# Patient Record
Sex: Female | Born: 1990 | Race: Black or African American | Hispanic: No | Marital: Single | Smoking: Never smoker
Health system: Southern US, Community
[De-identification: ages and names within clinical notes are randomized; demographics above are authoritative.]

## PROBLEM LIST (undated history)

## (undated) ENCOUNTER — Inpatient Hospital Stay (HOSPITAL_COMMUNITY): Payer: Self-pay

## (undated) DIAGNOSIS — D6800 Von Willebrand disease, unspecified: Secondary | ICD-10-CM

## (undated) DIAGNOSIS — B009 Herpesviral infection, unspecified: Secondary | ICD-10-CM

## (undated) DIAGNOSIS — S025XXA Fracture of tooth (traumatic), initial encounter for closed fracture: Secondary | ICD-10-CM

## (undated) HISTORY — PX: TONSILLECTOMY: SUR1361

## (undated) HISTORY — PX: INDUCED ABORTION: SHX677

## (undated) HISTORY — PX: DILATION AND CURETTAGE OF UTERUS: SHX78

## (undated) HISTORY — DX: Von Willebrand's disease: D68.0

## (undated) HISTORY — DX: Von Willebrand disease, unspecified: D68.00

---

## 2016-03-25 ENCOUNTER — Emergency Department (HOSPITAL_BASED_OUTPATIENT_CLINIC_OR_DEPARTMENT_OTHER)
Admission: EM | Admit: 2016-03-25 | Discharge: 2016-03-25 | Disposition: A | Discharge status: Home / Self Care | Payer: Commercial Managed Care - HMO | Attending: Dermatology | Admitting: Dermatology

## 2016-03-25 ENCOUNTER — Encounter (HOSPITAL_BASED_OUTPATIENT_CLINIC_OR_DEPARTMENT_OTHER): Payer: Self-pay

## 2016-03-25 DIAGNOSIS — Z5321 Procedure and treatment not carried out due to patient leaving prior to being seen by health care provider: Secondary | ICD-10-CM | POA: Insufficient documentation

## 2016-03-25 DIAGNOSIS — R51 Headache: Secondary | ICD-10-CM | POA: Insufficient documentation

## 2016-03-25 HISTORY — DX: Von Willebrand's disease: D68.0

## 2016-03-25 HISTORY — DX: Von Willebrand disease, unspecified: D68.00

## 2016-03-25 NOTE — ED Triage Notes (Signed)
C/o chills, body aches, HA 'feel like I have the flu" x today-NAD-steady gait-no flu shot

## 2016-03-25 NOTE — ED Notes (Signed)
Pt LWBS d/t wait time.  

## 2017-08-16 ENCOUNTER — Other Ambulatory Visit: Payer: Self-pay

## 2017-08-16 ENCOUNTER — Encounter (HOSPITAL_BASED_OUTPATIENT_CLINIC_OR_DEPARTMENT_OTHER): Payer: Self-pay | Admitting: *Deleted

## 2017-08-16 ENCOUNTER — Emergency Department (HOSPITAL_BASED_OUTPATIENT_CLINIC_OR_DEPARTMENT_OTHER)
Admission: EM | Admit: 2017-08-16 | Discharge: 2017-08-16 | Disposition: A | Discharge status: Home / Self Care | Payer: 59 | Attending: Emergency Medicine | Admitting: Emergency Medicine

## 2017-08-16 DIAGNOSIS — F411 Generalized anxiety disorder: Secondary | ICD-10-CM | POA: Diagnosis not present

## 2017-08-16 DIAGNOSIS — F329 Major depressive disorder, single episode, unspecified: Secondary | ICD-10-CM | POA: Diagnosis not present

## 2017-08-16 DIAGNOSIS — Z3A01 Less than 8 weeks gestation of pregnancy: Secondary | ICD-10-CM

## 2017-08-16 DIAGNOSIS — O9989 Other specified diseases and conditions complicating pregnancy, childbirth and the puerperium: Secondary | ICD-10-CM | POA: Insufficient documentation

## 2017-08-16 DIAGNOSIS — F4310 Post-traumatic stress disorder, unspecified: Secondary | ICD-10-CM | POA: Insufficient documentation

## 2017-08-16 DIAGNOSIS — F32A Depression, unspecified: Secondary | ICD-10-CM

## 2017-08-16 LAB — COMPREHENSIVE METABOLIC PANEL
ALT: 11 U/L — AB (ref 14–54)
AST: 16 U/L (ref 15–41)
Albumin: 4.1 g/dL (ref 3.5–5.0)
Alkaline Phosphatase: 38 U/L (ref 38–126)
Anion gap: 8 (ref 5–15)
BUN: 9 mg/dL (ref 6–20)
CHLORIDE: 103 mmol/L (ref 101–111)
CO2: 23 mmol/L (ref 22–32)
CREATININE: 0.78 mg/dL (ref 0.44–1.00)
Calcium: 8.7 mg/dL — ABNORMAL LOW (ref 8.9–10.3)
GFR calc Af Amer: >60 mL/min (ref >60)
Glucose, Bld: 97 mg/dL (ref 65–99)
Potassium: 3.5 mmol/L (ref 3.5–5.1)
Sodium: 134 mmol/L — ABNORMAL LOW (ref 135–145)
TOTAL PROTEIN: 7.1 g/dL (ref 6.5–8.1)
Total Bilirubin: 0.5 mg/dL (ref 0.3–1.2)

## 2017-08-16 LAB — CBC WITH DIFFERENTIAL/PLATELET
Basophils Absolute: 0 10*3/uL (ref 0.0–0.1)
Basophils Relative: 0 %
EOS PCT: 0 %
Eosinophils Absolute: 0 10*3/uL (ref 0.0–0.7)
HCT: 39.4 % (ref 36.0–46.0)
Hemoglobin: 13.6 g/dL (ref 12.0–15.0)
LYMPHS ABS: 1.7 10*3/uL (ref 0.7–4.0)
LYMPHS PCT: 22 %
MCH: 30.3 pg (ref 26.0–34.0)
MCHC: 34.5 g/dL (ref 30.0–36.0)
MCV: 87.8 fL (ref 78.0–100.0)
MONO ABS: 0.5 10*3/uL (ref 0.1–1.0)
Monocytes Relative: 6 %
Neutro Abs: 5.6 10*3/uL (ref 1.7–7.7)
Neutrophils Relative %: 72 %
PLATELETS: 216 10*3/uL (ref 150–400)
RBC: 4.49 MIL/uL (ref 3.87–5.11)
RDW: 13.3 % (ref 11.5–15.5)
WBC: 7.8 10*3/uL (ref 4.0–10.5)

## 2017-08-16 LAB — RAPID URINE DRUG SCREEN, HOSP PERFORMED
AMPHETAMINES: NOT DETECTED
Barbiturates: NOT DETECTED
Benzodiazepines: NOT DETECTED
Cocaine: NOT DETECTED
Opiates: NOT DETECTED
Tetrahydrocannabinol: POSITIVE — AB

## 2017-08-16 LAB — HCG, QUANTITATIVE, PREGNANCY: hCG, Beta Chain, Quant, S: 130 m[IU]/mL — ABNORMAL HIGH (ref <5)

## 2017-08-16 LAB — ETHANOL

## 2017-08-16 LAB — TSH: TSH: 1.623 u[IU]/mL (ref 0.350–4.500)

## 2017-08-16 LAB — PREGNANCY, URINE: Preg Test, Ur: POSITIVE — AB
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
## 2017-08-16 NOTE — ED Triage Notes (Signed)
Pt c/o "depression" denies SI or HI , states increased stress and insomnia x 2 weeks

## 2017-08-16 NOTE — ED Notes (Signed)
TTS at bedside speaking with pt 
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
## 2017-08-16 NOTE — Discharge Instructions (Addendum)
Take Benadryl as needed for sleep.  [facility]  Follow-up with the resources given for the depression/PTSD.

## 2017-08-29 ENCOUNTER — Other Ambulatory Visit: Payer: Self-pay

## 2017-08-29 ENCOUNTER — Encounter (HOSPITAL_BASED_OUTPATIENT_CLINIC_OR_DEPARTMENT_OTHER): Payer: Self-pay | Admitting: *Deleted

## 2017-08-29 ENCOUNTER — Emergency Department (HOSPITAL_BASED_OUTPATIENT_CLINIC_OR_DEPARTMENT_OTHER): Payer: 59

## 2017-08-29 ENCOUNTER — Emergency Department (HOSPITAL_BASED_OUTPATIENT_CLINIC_OR_DEPARTMENT_OTHER)
Admission: EM | Admit: 2017-08-29 | Discharge: 2017-08-29 | Disposition: A | Discharge status: Home / Self Care | Payer: 59 | Attending: Emergency Medicine | Admitting: Emergency Medicine

## 2017-08-29 DIAGNOSIS — Z3A01 Less than 8 weeks gestation of pregnancy: Secondary | ICD-10-CM | POA: Insufficient documentation

## 2017-08-29 DIAGNOSIS — R1030 Lower abdominal pain, unspecified: Secondary | ICD-10-CM | POA: Insufficient documentation

## 2017-08-29 DIAGNOSIS — O26851 Spotting complicating pregnancy, first trimester: Secondary | ICD-10-CM | POA: Insufficient documentation

## 2017-08-29 DIAGNOSIS — O4690 Antepartum hemorrhage, unspecified, unspecified trimester: Secondary | ICD-10-CM

## 2017-08-29 LAB — URINALYSIS, ROUTINE W REFLEX MICROSCOPIC
BILIRUBIN URINE: NEGATIVE
Bilirubin Urine: NEGATIVE
GLUCOSE, UA: NEGATIVE mg/dL
GLUCOSE, UA: NEGATIVE mg/dL
HGB URINE DIPSTICK: NEGATIVE
KETONES UR: NEGATIVE mg/dL
KETONES UR: NEGATIVE mg/dL
Leukocytes, UA: NEGATIVE
NITRITE: NEGATIVE
Nitrite: NEGATIVE
PH: 6 (ref 5.0–8.0)
PROTEIN: NEGATIVE mg/dL
Protein, ur: NEGATIVE mg/dL
Specific Gravity, Urine: >1.03 — ABNORMAL HIGH (ref 1.005–1.030)
Specific Gravity, Urine: <1.005 — ABNORMAL LOW (ref 1.005–1.030)
pH: 7 (ref 5.0–8.0)

## 2017-08-29 LAB — WET PREP, GENITAL
SPERM: NONE SEEN
Trich, Wet Prep: NONE SEEN
Yeast Wet Prep HPF POC: NONE SEEN

## 2017-08-29 LAB — URINALYSIS, MICROSCOPIC (REFLEX)

## 2017-08-29 LAB — BASIC METABOLIC PANEL
ANION GAP: 7 (ref 5–15)
BUN: 8 mg/dL (ref 6–20)
CALCIUM: 8.6 mg/dL — AB (ref 8.9–10.3)
CO2: 23 mmol/L (ref 22–32)
Chloride: 106 mmol/L (ref 101–111)
Creatinine, Ser: 0.73 mg/dL (ref 0.44–1.00)
GLUCOSE: 100 mg/dL — AB (ref 65–99)
POTASSIUM: 3.6 mmol/L (ref 3.5–5.1)
Sodium: 136 mmol/L (ref 135–145)

## 2017-08-29 LAB — CBC WITH DIFFERENTIAL/PLATELET
BASOS ABS: 0 10*3/uL (ref 0.0–0.1)
BASOS PCT: 0 %
Eosinophils Absolute: 0 10*3/uL (ref 0.0–0.7)
Eosinophils Relative: 0 %
HEMATOCRIT: 37.8 % (ref 36.0–46.0)
HEMOGLOBIN: 13.1 g/dL (ref 12.0–15.0)
LYMPHS PCT: 23 %
Lymphs Abs: 1.6 10*3/uL (ref 0.7–4.0)
MCH: 30.2 pg (ref 26.0–34.0)
MCHC: 34.7 g/dL (ref 30.0–36.0)
MCV: 87.1 fL (ref 78.0–100.0)
MONO ABS: 0.4 10*3/uL (ref 0.1–1.0)
MONOS PCT: 5 %
NEUTROS ABS: 5.1 10*3/uL (ref 1.7–7.7)
NEUTROS PCT: 72 %
Platelets: 243 10*3/uL (ref 150–400)
RBC: 4.34 MIL/uL (ref 3.87–5.11)
RDW: 13.1 % (ref 11.5–15.5)
WBC: 7.1 10*3/uL (ref 4.0–10.5)

## 2017-08-29 LAB — HCG, QUANTITATIVE, PREGNANCY: hCG, Beta Chain, Quant, S: 1999 m[IU]/mL — ABNORMAL HIGH (ref <5)

## 2017-08-29 LAB — ABO/RH
ABO/RH(D): B NEG
WEAK D: POSITIVE

## 2017-08-29 LAB — RH IG WORKUP (INCLUDES ABO/RH)
ABO/RH(D): B NEG
ANTIBODY SCREEN: NEGATIVE
GESTATIONAL AGE(WKS): 6

## 2017-08-29 MED ORDER — RHO D IMMUNE GLOBULIN 1500 UNIT/2ML IJ SOSY
300.0000 ug | PREFILLED_SYRINGE | Freq: Once | INTRAMUSCULAR | Status: AC
  Administered 2017-08-29: 300 ug via INTRAMUSCULAR
  Filled 2017-08-29: qty 2

## 2017-08-29 MED ORDER — PRENATAL COMPLETE 14-0.4 MG PO TABS: ORAL_TABLET | ORAL | 0 refills | Status: DC

## 2017-08-29 NOTE — ED Provider Notes (Signed)
[facility] EMERGENCY DEPARTMENT Provider Note   CSN: [id number] Arrival date & time: 08/29/17  1458     History   Chief Complaint Chief Complaint  Patient presents with  . Vaginal Bleeding    HPI Jessica Rojas is a 27 y.o. female G1P0 who is currently pregnant (diagnosed during recent visit to the ED on 4/17) with a history of von Willebrand's disease who presents emergency department today for concerns of vaginal bleeding.  Patient states that she finished urinating when she wiped and noticed a dime sized blood clot on the tissue paper.  She notes this is the only occurrence of eating.  No gross vaginal eating.  No other blood clots.  Notes over the last several days she has had some lower abdominal cramping but denies any pain.  She notes she has not followed up with OB/GYN and has an appointment on May 6 to establish care.  She has not been taking prenatal vitamins.  She denies any fever, chills, nausea/vomiting, urinary frequency, urinary urgency, dysuria, headache, vaginal discharge, abdominal trauma. Patinet's LMP on March 17th.   HPI  Past Medical History:  Diagnosis Date  . Von Willebrand disease (HCC)     There are no active problems to display for this patient.   Past Surgical History:  Procedure Laterality Date  . TONSILLECTOMY       OB History    Gravida  1   Para      Term      Preterm      AB      Living        SAB      TAB      Ectopic      Multiple      Live Births               Home Medications    Prior to Admission medications   Not on File    Family History History reviewed. No pertinent family history.  Social History Social History   Tobacco Use  . Smoking status: Never Smoker  . Smokeless tobacco: Never Used  Substance Use Topics  . Alcohol use: No  . Drug use: No     Allergies   Tramadol   Review of Systems Review of Systems  All other systems reviewed and are negative.    Physical  Exam Updated Vital Signs Ht  (1.575 m)   Wt 64.9 kg (143 lb)   LMP 07/16/2017   BMI 26.16 kg/m   Physical Exam  Constitutional: She appears well-developed and well-nourished.  HENT:  Head: Normocephalic and atraumatic.  Right Ear: External ear normal.  Left Ear: External ear normal.  Nose: Nose normal.  Mouth/Throat: Uvula is midline, oropharynx is clear and moist and mucous membranes are normal. No tonsillar exudate.  Eyes: Pupils are equal, round, and reactive to light. Right eye exhibits no discharge. Left eye exhibits no discharge. No scleral icterus.  Neck: Trachea normal. Neck supple. No spinous process tenderness present. No neck rigidity. Normal range of motion present.  Cardiovascular: Normal rate, regular rhythm and intact distal pulses.  No murmur heard. Pulses:      Radial pulses are 2+ on the right side, and 2+ on the left side.       Dorsalis pedis pulses are 2+ on the right side, and 2+ on the left side.       Posterior tibial pulses are 2+ on the right side, and 2+ on the  left side.  No lower extremity swelling or edema. Calves symmetric in size bilaterally.  Pulmonary/Chest: Effort normal and breath sounds normal. She exhibits no tenderness.  Abdominal: Soft. Bowel sounds are normal. She exhibits no distension. There is no tenderness. There is no rigidity, no rebound, no guarding and no CVA tenderness.  Mildly gravid abdomen without any distention, tenderness, rebound, rigidity or guarding.  Genitourinary:  Genitourinary Comments: Exam performed by Jacinto Halim, exam chaperoned Pelvic exam: normal external genitalia without evidence of trauma. VULVA: normal appearing vulva with no masses, tenderness or lesion. VAGINA: normal appearing vagina with normal color and discharge, no lesions. CERVIX: normal appearing cervix without lesions, cervical motion tenderness absent, cervical os closed with out purulent discharge or bleeding. Wet prep and DNA probe for  chlamydia and GC obtained.   ADNEXA: normal adnexa in size, nontender and no masses UTERUS: uterus is normal size, shape, consistency and nontender.   Musculoskeletal: She exhibits no edema.  Lymphadenopathy:    She has no cervical adenopathy.  Neurological: She is alert.  Skin: Skin is warm and dry. No rash noted. She is not diaphoretic.  Psychiatric: She has a normal mood and affect.  Nursing note and vitals reviewed.    ED Treatments / Results  Labs (all labs ordered are listed, but only abnormal results are displayed) Labs Reviewed  WET PREP, GENITAL - Abnormal; Notable for the following components:      Result Value   Clue Cells Wet Prep HPF POC PRESENT (*)    WBC, Wet Prep HPF POC MODERATE (*)    All other components within normal limits  URINALYSIS, ROUTINE W REFLEX MICROSCOPIC - Abnormal; Notable for the following components:   APPearance CLOUDY (*)    Specific Gravity, Urine >1.030 (*)    Hgb urine dipstick LARGE (*)    Leukocytes, UA TRACE (*)    All other components within normal limits  BASIC METABOLIC PANEL - Abnormal; Notable for the following components:   Glucose, Bld 100 (*)    Calcium 8.6 (*)    All other components within normal limits  HCG, QUANTITATIVE, PREGNANCY - Abnormal; Notable for the following components:   hCG, Beta Chain, Quant, S 1,999 (*)    All other components within normal limits  URINALYSIS, MICROSCOPIC (REFLEX) - Abnormal; Notable for the following components:   Bacteria, UA MANY (*)    All other components within normal limits  URINALYSIS, ROUTINE W REFLEX MICROSCOPIC - Abnormal; Notable for the following components:   Specific Gravity, Urine <1.005 (*)    All other components within normal limits  URINE CULTURE  CBC WITH DIFFERENTIAL/PLATELET  ABO/RH  RH IG WORKUP (INCLUDES ABO/RH)  GC/CHLAMYDIA PROBE AMP ([place]) NOT AT [facility]    EKG None  Radiology US Ob Comp < 14 Wks  Result Date: 08/29/2017 CLINICAL DATA:  Vaginal  bleeding. EXAM: OBSTETRIC <14 WK [place] AND TRANSVAGINAL OB US TECHNIQUE: Both transabdominal and transvaginal ultrasound examinations were performed for complete evaluation of the gestation as well as the maternal uterus, adnexal regions, and pelvic cul-de-sac. Transvaginal technique was performed to assess early pregnancy. COMPARISON:  None. FINDINGS: Intrauterine gestational sac: Single Yolk sac:  Not Visualized. Embryo:  Not Visualized. Cardiac Activity: Not Visualized. MSD: 5 mm   5 w   2 d Maternal uterus/adnexae: Subchorionic hemorrhage: None visualized. Right ovary: Normal Left ovary: Small heterogeneous structure within the left ovary measures 1.1 x 0.6 x 0.6 cm. Other :None Free fluid:  None IMPRESSION: 1. Probable  early intrauterine gestational sac, but no yolk sac, fetal pole, or cardiac activity yet visualized. Recommend follow-up quantitative B-HCG levels and follow-up US in 14 days to assess viability. This recommendation follows SRU consensus guidelines: Diagnostic Criteria for Nonviable Pregnancy Early in the First Trimester. Malva Limes Med 2013; 295:6213-08. 2. Small heterogeneous solid-appearing structure within the left ovary measuring 11 mm. Indeterminate. Attention on follow-up imaging advise. Electronically Signed   By: Signa Kell M.D.   On: 08/29/2017 17:11   US Ob Transvaginal  Result Date: 08/29/2017 CLINICAL DATA:  Vaginal bleeding. EXAM: OBSTETRIC <14 WK [place] AND TRANSVAGINAL OB US TECHNIQUE: Both transabdominal and transvaginal ultrasound examinations were performed for complete evaluation of the gestation as well as the maternal uterus, adnexal regions, and pelvic cul-de-sac. Transvaginal technique was performed to assess early pregnancy. COMPARISON:  None. FINDINGS: Intrauterine gestational sac: Single Yolk sac:  Not Visualized. Embryo:  Not Visualized. Cardiac Activity: Not Visualized. MSD: 5 mm   5 w   2 d Maternal uterus/adnexae: Subchorionic hemorrhage: None visualized. Right  ovary: Normal Left ovary: Small heterogeneous structure within the left ovary measures 1.1 x 0.6 x 0.6 cm. Other :None Free fluid:  None IMPRESSION: 1. Probable early intrauterine gestational sac, but no yolk sac, fetal pole, or cardiac activity yet visualized. Recommend follow-up quantitative B-HCG levels and follow-up US in 14 days to assess viability. This recommendation follows SRU consensus guidelines: Diagnostic Criteria for Nonviable Pregnancy Early in the First Trimester. Malva Limes Med 2013; 657:8469-62. 2. Small heterogeneous solid-appearing structure within the left ovary measuring 11 mm. Indeterminate. Attention on follow-up imaging advise. Electronically Signed   By: Signa Kell M.D.   On: 08/29/2017 17:11    Procedures Procedures (including critical care time)  Medications Ordered in ED Medications - No data to display   Initial Impression / Assessment and Plan / ED Course  I have reviewed the triage vital signs and the nursing notes.  Pertinent labs & imaging results that were available during my care of the patient were reviewed by me and considered in my medical decision making (see chart for details).     27 y.o. female G1P0 who is currently pregnant (diagnosed during recent visit to the ED on 4/17 with LMP on March 17th) with a history of von Willebrand's disease presenting for vaginal bleeding (she noticed a dime sized blood clot on tissue of toilet paper earlier today). She notes she has had some abdominal cramping but denies pain. Her vital signs are reassuring on presentation. She is without hypotension, tachycardia and she appears non-ill on exam (also no HTN or HA). Her abdomen is mildly gravid without distension, tenderness, or peritoneal signs.  Cervical os is closed and no bleeding or discharge is visualized.  Blood work and ultrasound ordered to evaluate.  Patient is an initial urinalysis was a contaminated catch.  Repeat urinalysis without evidence of infection.   Blood work is reassuring.  No anemia.  Beta HcG 1199.  Ultrasound shows probable early intrauterine gestational sac but no yolk sac, fetal pole or cardiac activity.  Possible threatened abortion.  I discussed these results with Dr. Jolayne Panther of women's who recommended that the patient follow-up at the med center OB/GYN location (where she is to establish care on May 6) to have repeat blood testing done.  She recommended that we keep the patient until the ABO/Rh has resulted.  She asked that if patient is Rh- we give a dose of RhoGam. I have discussed these findings with  the patient.  We have talked about the importance of following up.  We have discussed return precautions and she states understanding.  With ABO/Rh pending, case signed out to [employer].  Plan if patient is Rh- to give dose of RhoGam.  Patient is to follow-up with OB in 2 days, Thursday, 08/31/2017) to have repeat blood testing done.  Patient needs to be started on prenatal vitamins.    Patient case discussed with Dr. Dalene Seltzer who is in agreement with plan.  Final Clinical Impressions(s) / ED Diagnoses   Final diagnoses:  Vaginal bleeding in pregnancy    ED Discharge Orders        Ordered    Prenatal Vit-Fe Fumarate-FA (PRENATAL COMPLETE) 14-0.4 MG TABS     08/29/17 1908       Princella Pellegrini 08/29/17 1917    Alvira Monday, MD 08/31/17 1324

## 2017-08-29 NOTE — Discharge Instructions (Addendum)
You were seen here today for vaginal bleeding in the setting of pregnancy. You received blood work, urinalysis as well as an ultrasound. Blood work shows normal hemoglobin with a beta-hCG of 1199.  Your ultrasound shows probable early intrauterine gestational sac but no yolk sac, fetal pole or cardiac activity.  I have discussed your case with the OBGYN on call. They would like you to follow up in 2 days for repeat blood work at the [facility] office here at [employer].  Please return sooner if you have any abdominal pain, vaginal bleeding or other concerning symptoms.

## 2017-08-29 NOTE — ED Triage Notes (Signed)
Pt c/ vaginal bleeding with clots ,preg LMP march 17 ^ weeks preg,  Lower abd cramping , denies back pain

## 2017-08-29 NOTE — ED Provider Notes (Signed)
Signout from [employer], PA-C at shift change  Patient with vaginal bleeding in the setting of pregnancy.  hCG is 1999.  Otherwise labs stable.  Ultrasound shows probable early intrauterine gestational sac, but no yolk sac, fetal pole, or cardiac activity visualized.  Casimiro Needle spoke with OB/GYN on-call who advised to follow-up in the clinic at 2 days.  Rh typing pending at time of transfer of care patient Rh- and RhoGam administered prior to discharge.  Patient understands and agrees with plan.  Patient vitals stable throughout ED course and discharged in satisfactory condition.   Emi Holes, PA-C 08/30/17 1610    Alvira Monday, MD 09/02/17 1215

## 2017-08-30 LAB — URINE CULTURE: CULTURE: NO GROWTH

## 2017-08-30 LAB — GC/CHLAMYDIA PROBE AMP (~~LOC~~) NOT AT ARMC
Chlamydia: NEGATIVE
Neisseria Gonorrhea: NEGATIVE

## 2017-08-31 ENCOUNTER — Other Ambulatory Visit: Payer: 59

## 2017-08-31 DIAGNOSIS — O4690 Antepartum hemorrhage, unspecified, unspecified trimester: Secondary | ICD-10-CM

## 2017-08-31 NOTE — Progress Notes (Signed)
Follow up HCG from ED per Dr. Jolayne Panther. Sent to lab for collection. Armandina Stammer RN

## 2017-08-31 NOTE — Addendum Note (Signed)
Addended by: Anell Barr on: 08/31/2017 08:49 AM   Modules accepted: Orders

## 2017-09-01 ENCOUNTER — Inpatient Hospital Stay (HOSPITAL_COMMUNITY): Payer: 59

## 2017-09-01 ENCOUNTER — Telehealth: Payer: Self-pay

## 2017-09-01 ENCOUNTER — Encounter (HOSPITAL_COMMUNITY): Payer: Self-pay | Admitting: *Deleted

## 2017-09-01 ENCOUNTER — Inpatient Hospital Stay (HOSPITAL_COMMUNITY)
Admission: AD | Admit: 2017-09-01 | Discharge: 2017-09-01 | Disposition: A | Discharge status: Home / Self Care | Payer: 59 | Source: Ambulatory Visit | Attending: Obstetrics & Gynecology | Admitting: Obstetrics & Gynecology

## 2017-09-01 DIAGNOSIS — Z3A01 Less than 8 weeks gestation of pregnancy: Secondary | ICD-10-CM | POA: Insufficient documentation

## 2017-09-01 DIAGNOSIS — O26851 Spotting complicating pregnancy, first trimester: Secondary | ICD-10-CM | POA: Diagnosis present

## 2017-09-01 DIAGNOSIS — O26859 Spotting complicating pregnancy, unspecified trimester: Secondary | ICD-10-CM

## 2017-09-01 LAB — URINALYSIS, ROUTINE W REFLEX MICROSCOPIC
Bilirubin Urine: NEGATIVE
GLUCOSE, UA: NEGATIVE mg/dL
HGB URINE DIPSTICK: NEGATIVE
KETONES UR: 80 mg/dL — AB
LEUKOCYTES UA: NEGATIVE
Nitrite: NEGATIVE
PROTEIN: NEGATIVE mg/dL
Specific Gravity, Urine: 1.023 (ref 1.005–1.030)
pH: 6 (ref 5.0–8.0)

## 2017-09-01 LAB — BETA HCG QUANT (REF LAB): hCG Quant: 2025 m[IU]/mL

## 2017-09-01 NOTE — MAU Note (Signed)
Possible SAB. Was seen Tues for spotting and had u/s and saw sac in uterus. Had repeat BHCG yest and did not rise appropriately. Had some spotting today but not a lot but is concerned. Mild cramping.

## 2017-09-01 NOTE — MAU Provider Note (Signed)
History     CSN: [id number]  Arrival date and time: 09/01/17 1946   First Provider Initiated Contact with Patient 09/01/17 2213      Chief Complaint  Patient presents with  . Vaginal Bleeding   HPI Ms. Jessica Rojas is a 27 y.o. G4P0030 at [redacted]w[redacted]d who presents to MAU today with complaint of spotting in pregnancy. The patient was seen and had IUGS without YS or FP seen last week when spotting started. It is unchanged, but when she went for hCG follow-up yesterday she was told rise was inappropriate and she was worried. She denies heavy bleeding, change in bleeding, pain or fever.   OB History    Gravida  4   Para      Term      Preterm      AB  3   Living  0     SAB      TAB  3   Ectopic      Multiple      Live Births              Past Medical History:  Diagnosis Date  . Von Willebrand disease (HCC)     Past Surgical History:  Procedure Laterality Date  . INDUCED ABORTION    . TONSILLECTOMY      History reviewed. No pertinent family history.  Social History   Tobacco Use  . Smoking status: Never Smoker  . Smokeless tobacco: Never Used  Substance Use Topics  . Alcohol use: No  . Drug use: No    Allergies:  Allergies  Allergen Reactions  . Tramadol Itching    Medications Prior to Admission  Medication Sig Dispense Refill Last Dose  . Prenatal Vit-Fe Fumarate-FA (PRENATAL COMPLETE) 14-0.4 MG TABS Take one pill daily. 60 each 0 09/01/2017 at Unknown time    Review of Systems  Constitutional: Negative for fever.  Gastrointestinal: Negative for abdominal pain, constipation, diarrhea, nausea and vomiting.  Genitourinary: Positive for vaginal bleeding. Negative for vaginal discharge.   Physical Exam   Blood pressure (!) 103/59, pulse 87, temperature 99.1 F (37.3 C), height  (1.575 m), weight 149 lb (67.6 kg), last menstrual period 07/17/2017.  Physical Exam  Nursing note and vitals reviewed. Constitutional: She is oriented to person,  place, and time. She appears well-developed and well-nourished. No distress.  HENT:  Head: Normocephalic and atraumatic.  Cardiovascular: Normal rate.  Respiratory: Effort normal.  GI: Soft. She exhibits no distension and no mass. There is no tenderness. There is no rebound and no guarding.  Neurological: She is alert and oriented to person, place, and time.  Skin: Skin is warm and dry. No erythema.  Psychiatric: She has a normal mood and affect.    Results for orders placed or performed during the hospital encounter of 09/01/17 (from the past 24 hour(s))  Urinalysis, Routine w reflex microscopic     Status: Abnormal   Collection Time: 09/01/17  8:15 PM  Result Value Ref Range   Color, Urine YELLOW YELLOW   APPearance HAZY (A) CLEAR   Specific Gravity, Urine 1.023 1.005 - 1.030   pH 6.0 5.0 - 8.0   Glucose, UA NEGATIVE NEGATIVE mg/dL   Hgb urine dipstick NEGATIVE NEGATIVE   Bilirubin Urine NEGATIVE NEGATIVE   Ketones, ur 80 (A) NEGATIVE mg/dL   Protein, ur NEGATIVE NEGATIVE mg/dL   Nitrite NEGATIVE NEGATIVE   Leukocytes, UA NEGATIVE NEGATIVE   US Ob Transvaginal  Result Date: 09/01/2017 CLINICAL DATA:  27 year old female with spotting. LMP: 07/17/2017 corresponding to an estimated gestational age of [redacted] weeks, 4 days. EXAM: TRANSVAGINAL OB ULTRASOUND TECHNIQUE: Transvaginal ultrasound was performed for complete evaluation of the gestation as well as the maternal uterus, adnexal regions, and pelvic cul-de-sac. COMPARISON:  Ultrasound dated 08/29/2017 FINDINGS: Intrauterine gestational sac: Single intrauterine gestational sac. Yolk sac:  Seen Embryo:  Not present at this time. Cardiac Activity: N/A MSD: 7 mm   5 w   2 d Subchorionic hemorrhage:  None visualized. Maternal uterus/adnexae: The maternal ovaries appear unremarkable. IMPRESSION: Single intrauterine gestational sac with an estimated gestational age of [redacted] weeks, 2 days based on today's mean sac diameter. No fetal pole identified at  this time. Continued follow-up with ultrasound in 7-11 days, or earlier if clinically indicated, recommended. Electronically Signed   By: Elgie Collard M.D.   On: 09/01/2017 22:05    MAU Course  Procedures None  MDM [place] today shows appropriate increase in GS size and development of YS.   Assessment and Plan  A: SIUP at 100w2d Spotting in pregnancy, first trimester   P:  Discharge home Bleeding/first trimester precautions discussed Patient advised to follow-up with [facility] as planned next week to start prenatal care Patient may return to MAU as needed or if her condition were to change or worsen   Vonzella Nipple, PA-C 09/01/2017, 10:19 PM

## 2017-09-01 NOTE — Progress Notes (Signed)
Julie Wenzel PA in to discuss u/s results and d/c plan. Written and verbal d/c instructions given and understanding voiced 

## 2017-09-01 NOTE — Discharge Instructions (Signed)
Vaginal Bleeding During Pregnancy, First Trimester °A small amount of bleeding (spotting) from the vagina is common in early pregnancy. Sometimes the bleeding is normal and is not a problem, and sometimes it is a sign of something serious. Be sure to tell your doctor about any bleeding from your vagina right away. °Follow these instructions at home: °· Watch your condition for any changes. °· Follow your doctor's instructions about how active you can be. °· If you are on bed rest: °? You may need to stay in bed and only get up to use the bathroom. °? You may be allowed to do some activities. °? If you need help, make plans for someone to help you. °· Write down: °? The number of pads you use each day. °? How often you change pads. °? How soaked (saturated) your pads are. °· Do not use tampons. °· Do not douche. °· Do not have sex or orgasms until your doctor says it is okay. °· If you pass any tissue from your vagina, save the tissue so you can show it to your doctor. °· Only take medicines as told by your doctor. °· Do not take aspirin because it can make you bleed. °· Keep all follow-up visits as told by your doctor. °Contact a doctor if: °· You bleed from your vagina. °· You have cramps. °· You have labor pains. °· You have a fever that does not go away after you take medicine. °Get help right away if: °· You have very bad cramps in your back or belly (abdomen). °· You pass large clots or tissue from your vagina. °· You bleed more. °· You feel light-headed or weak. °· You pass out (faint). °· You have chills. °· You are leaking fluid or have a gush of fluid from your vagina. °· You pass out while pooping (having a bowel movement). °This information is not intended to replace advice given to you by your health care provider. Make sure you discuss any questions you have with your health care provider. °Document Released: 09/02/2013 Document Revised: 09/24/2015 Document Reviewed: 12/24/2012 °Elsevier Interactive  Patient Education © 2018 Elsevier Inc. ° °

## 2017-09-01 NOTE — Telephone Encounter (Signed)
Patient called and made aware of HCG levels not rising appropriately. Reviewed bleeding precautions and advised if bleeding heavily she can go to [facility] [place] for evaluation. Also advised to go for any sever onset of abdominal pain.  Patient states understanding. Will keep appointment on Monday to discuss further plan of care. Armandina Stammer RN

## 2017-09-04 ENCOUNTER — Ambulatory Visit (INDEPENDENT_AMBULATORY_CARE_PROVIDER_SITE_OTHER): Payer: 59 | Admitting: Family Medicine

## 2017-09-04 ENCOUNTER — Encounter: Payer: Self-pay | Admitting: Family Medicine

## 2017-09-04 VITALS — BP 108/63 | Ht 62.0 in | Wt 149.0 lb

## 2017-09-04 DIAGNOSIS — O3680X0 Pregnancy with inconclusive fetal viability, not applicable or unspecified: Secondary | ICD-10-CM

## 2017-09-04 NOTE — Progress Notes (Signed)
   Subjective:    Patient ID: Jessica Rojas, female    DOB: [birth year], 28 y.o.   MRN: [id number]  HPI Patient seen for follow up of early pregnancy, spotting, and abnormal hormone rise. She had some spotting last week, went to ED on 4/30 with quant and [place], then had repeat quant on 5/2, with a repeat US on 5/3.   4/30: hcg quant 1999 (I did call to [facility] ED - they do not have an i-STAT hCG) 5/02: hcg quant 2025  [place] on 4/30: probable gestational sac measuring 5 weeks 2 days with no yolk sac, fetal pole, embryo. Ultrasound on 5/3: Probable gestational sac measuring 5 weeks 2 days with yolk sac present.  No fetal pole.  Patient otherwise feels well no cramping or contractions.  No spotting today  Review of Systems     Objective:   Physical Exam  Constitutional: She appears well-developed and well-nourished.  Cardiovascular: Normal rate and regular rhythm.  Pulmonary/Chest: Effort normal and breath sounds normal.  Abdominal: Soft. She exhibits no distension and no mass. There is no tenderness. There is no guarding.  Skin: Skin is warm and dry. Capillary refill takes less than 2 seconds.  Psychiatric: She has a normal mood and affect. Her behavior is normal. Judgment and thought content normal.      Assessment & Plan:  1. Pregnancy with uncertain fetal viability, single or unspecified fetus I discussed in detail the laboratory findings and the ultrasounds results.  I discussed that this was likely a nonviable pregnancy given the inappropriate rise in hCG level.  I also discussed with her that even with the appearance of yolk sac, this may be a pregnancy that is at high risk for failure or spontaneous abortion.  As options, I did give her choice to consider this a failed pregnancy with termination with Cytotec or to repeat an ultrasound in 10 days to evaluate viability.  Patient did opt to return in 10 days for transvaginal ultrasound to evaluate for viability.  All questions answered.

## 2017-09-04 NOTE — Progress Notes (Signed)
Patient went to MAU and YS was present. Armandina Stammer RN

## 2017-09-14 ENCOUNTER — Ambulatory Visit (INDEPENDENT_AMBULATORY_CARE_PROVIDER_SITE_OTHER): Payer: 59 | Admitting: Family Medicine

## 2017-09-14 VITALS — Wt 150.0 lb

## 2017-09-14 DIAGNOSIS — O3680X0 Pregnancy with inconclusive fetal viability, not applicable or unspecified: Secondary | ICD-10-CM

## 2017-09-14 NOTE — Progress Notes (Signed)
Patient presents for Ultrasound follow to determine if today is New OB.

## 2017-09-14 NOTE — Progress Notes (Signed)
   Subjective:    Patient ID: Jessica Rojas, female    DOB: 1990-11-20, 27 y.o.   MRN: [id number]  HPI Patient seen for follow up of early pregnancy, spotting, and abnormal hormone rise. She had some spotting last week, went to ED on 4/30 with quant and [place], then had repeat quant on 5/2, with a repeat US on 5/3.   4/30: hcg quant 1999 (I did call to [facility] ED - they do not have an i-STAT hCG) 5/02: hcg quant 2025  [place] on 4/30: probable gestational sac measuring 5 weeks 2 days with no yolk sac, fetal pole, embryo. Ultrasound on 5/3: Probable gestational sac measuring 5 weeks 2 days with yolk sac present.  No fetal pole.  Today, the patient had a repeat ultrasound which showed a gestational sac and a fetal pole without cardiac activity.  The peak fetal pole measures 8.5 mm.  She reports no symptoms of vaginal bleeding or cramping.  Review of Systems     Objective:   Physical Exam  Constitutional: She appears well-developed and well-nourished.  Cardiovascular: Normal rate and regular rhythm.  Pulmonary/Chest: Effort normal.  Abdominal: Soft. She exhibits no distension and no mass. There is no tenderness. There is no guarding.  Skin: Skin is warm and dry. Capillary refill takes less than 2 seconds.  Psychiatric: She has a normal mood and affect. Her behavior is normal. Judgment and thought content normal.      Assessment & Plan:  1. Pregnancy with uncertain fetal viability, single or unspecified fetus The patient meets criteria for failed pregnancy: Embryo is greater than 7 mm with out cardiac activity.  Additionally, there is no embryo with heartbeat 11 days after the ultrasound showing gestational sac with yolk sac.  I discussed this with the patient and her mother.  I offered medical evaluation with Cytotec.  The patient would like one more ultrasound in a week to assure that there is no heartbeat.  We will schedule the patient and evaluate next week.

## 2017-09-21 ENCOUNTER — Encounter (HOSPITAL_COMMUNITY): Payer: Self-pay | Admitting: *Deleted

## 2017-09-21 ENCOUNTER — Ambulatory Visit (INDEPENDENT_AMBULATORY_CARE_PROVIDER_SITE_OTHER): Payer: 59 | Admitting: Obstetrics & Gynecology

## 2017-09-21 ENCOUNTER — Other Ambulatory Visit: Payer: Self-pay

## 2017-09-21 ENCOUNTER — Other Ambulatory Visit: Payer: Self-pay | Admitting: Obstetrics and Gynecology

## 2017-09-21 VITALS — BP 108/64 | HR 74 | Wt 154.0 lb

## 2017-09-21 DIAGNOSIS — D6800 Von Willebrand disease, unspecified: Secondary | ICD-10-CM

## 2017-09-21 DIAGNOSIS — O021 Missed abortion: Secondary | ICD-10-CM

## 2017-09-21 NOTE — Progress Notes (Signed)
DATING AND VIABILITY SONOGRAM   Laquitha Hayworth is a 27 y.o. year old G62P0030 with LMP Patient's last menstrual period was 07/17/2017. which would correlate to  [redacted]w[redacted]d weeks gestation.  She has regular menstrual cycles.   She is here today for a confirmatory initial sonogram.    GESTATION: SINGLETON  FETAL ACTIVITY:          No cardiac activity. Placed color doppler over gestational sac and    ADNEXA: The ovaries are normal.   GESTATIONAL AGE AND  BIOMETRICS:  Gestational criteria: Estimated Date of Delivery: 05/08/18 by LMP now at [redacted]w[redacted]d  Previous Scans:3  GESTATIONAL SAC           1.21 cm        6 weeks                                                                                       TECHNICIAN COMMENTS: Patient mother present for ultrasound. Made patient aware of no change in gestational sac size. Will sit and review with provider today.  A copy of this report including all images has been saved and backed up to a second source for retrieval if needed. All measures and details of the anatomical scan, placentation, fluid volume and pelvic anatomy are contained in that report.  Armandina Stammer 09/21/2017 10:46 AM

## 2017-09-21 NOTE — Progress Notes (Signed)
History:  27 y.o. G4P0030 here today for MAB. Pt is s/p EAB x3. LMP 07/16/2017. She conceived April 1-5. She is concerned as she was expecting to maintain this pregnancy.  She still reports pregnancy sx and did not initially believe that she was not pregnant.  The following portions of the patient's history were reviewed and updated as appropriate: allergies, current medications, past family history, past medical history, past social history, past surgical history and problem list.  Review of Systems:  Pertinent items are noted in HPI.    Objective:  Physical Exam Blood pressure 108/64, pulse 74, weight 154 lb (69.9 kg), last menstrual period 07/17/2017.  CONSTITUTIONAL: Well-developed, well-nourished female in no acute distress.  HENT:  Normocephalic, atraumatic EYES: Conjunctivae and EOM are normal. No scleral icterus.  NECK: Normal range of motion SKIN: Skin is warm and dry. No rash noted. Not diaphoretic.No pallor. NEUROLGIC: Alert and oriented to person, place, and time. Normal coordination.    Labs and Imaging US Ob Comp < 14 Wks  Result Date: 08/29/2017 CLINICAL DATA:  Vaginal bleeding. EXAM: OBSTETRIC <14 WK [place] AND TRANSVAGINAL OB US TECHNIQUE: Both transabdominal and transvaginal ultrasound examinations were performed for complete evaluation of the gestation as well as the maternal uterus, adnexal regions, and pelvic cul-de-sac. Transvaginal technique was performed to assess early pregnancy. COMPARISON:  None. FINDINGS: Intrauterine gestational sac: Single Yolk sac:  Not Visualized. Embryo:  Not Visualized. Cardiac Activity: Not Visualized. MSD: 5 mm   5 w   2 d Maternal uterus/adnexae: Subchorionic hemorrhage: None visualized. Right ovary: Normal Left ovary: Small heterogeneous structure within the left ovary measures 1.1 x 0.6 x 0.6 cm. Other :None Free fluid:  None IMPRESSION: 1. Probable early intrauterine gestational sac, but no yolk sac, fetal pole, or cardiac activity yet  visualized. Recommend follow-up quantitative B-HCG levels and follow-up US in 14 days to assess viability. This recommendation follows SRU consensus guidelines: Diagnostic Criteria for Nonviable Pregnancy Early in the First Trimester. Malva Limes Med 2013; 161:0960-45. 2. Small heterogeneous solid-appearing structure within the left ovary measuring 11 mm. Indeterminate. Attention on follow-up imaging advise. Electronically Signed   By: Signa Kell M.D.   On: 08/29/2017 17:11   US Ob Transvaginal  Result Date: 09/01/2017 CLINICAL DATA:  27 year old female with spotting. LMP: 07/17/2017 corresponding to an estimated gestational age of [redacted] weeks, 4 days. EXAM: TRANSVAGINAL OB ULTRASOUND TECHNIQUE: Transvaginal ultrasound was performed for complete evaluation of the gestation as well as the maternal uterus, adnexal regions, and pelvic cul-de-sac. COMPARISON:  Ultrasound dated 08/29/2017 FINDINGS: Intrauterine gestational sac: Single intrauterine gestational sac. Yolk sac:  Seen Embryo:  Not present at this time. Cardiac Activity: N/A MSD: 7 mm   5 w   2 d Subchorionic hemorrhage:  None visualized. Maternal uterus/adnexae: The maternal ovaries appear unremarkable. IMPRESSION: Single intrauterine gestational sac with an estimated gestational age of [redacted] weeks, 2 days based on today's mean sac diameter. No fetal pole identified at this time. Continued follow-up with ultrasound in 7-11 days, or earlier if clinically indicated, recommended. Electronically Signed   By: Elgie Collard M.D.   On: 09/01/2017 22:05   US Ob Transvaginal  Result Date: 08/29/2017 CLINICAL DATA:  Vaginal bleeding. EXAM: OBSTETRIC <14 WK [place] AND TRANSVAGINAL OB US TECHNIQUE: Both transabdominal and transvaginal ultrasound examinations were performed for complete evaluation of the gestation as well as the maternal uterus, adnexal regions, and pelvic cul-de-sac. Transvaginal technique was performed to assess early pregnancy. COMPARISON:  None.  FINDINGS: Intrauterine  gestational sac: Single Yolk sac:  Not Visualized. Embryo:  Not Visualized. Cardiac Activity: Not Visualized. MSD: 5 mm   5 w   2 d Maternal uterus/adnexae: Subchorionic hemorrhage: None visualized. Right ovary: Normal Left ovary: Small heterogeneous structure within the left ovary measures 1.1 x 0.6 x 0.6 cm. Other :None Free fluid:  None IMPRESSION: 1. Probable early intrauterine gestational sac, but no yolk sac, fetal pole, or cardiac activity yet visualized. Recommend follow-up quantitative B-HCG levels and follow-up US in 14 days to assess viability. This recommendation follows SRU consensus guidelines: Diagnostic Criteria for Nonviable Pregnancy Early in the First Trimester. Malva Limes Med 2013; 213:0865-78. 2. Small heterogeneous solid-appearing structure within the left ovary measuring 11 mm. Indeterminate. Attention on follow-up imaging advise. Electronically Signed   By: Signa Kell M.D.   On: 08/29/2017 17:11    Assessment & Plan:  Missed abortion. I have reviewed with pt medical vs surgical management. Pt declines medical management.  Patient desires surgical management with dilation and curetage.  The risks of surgery were discussed in detail with the patient including but not limited to: bleeding which may require transfusion or reoperation; infection which may require prolonged hospitalization or re-hospitalization and antibiotic therapy; injury to bowel, bladder, ureters and major vessels or other surrounding organs; need for additional procedures including laparotomy; thromboembolic phenomenon, incisional problems and other postoperative or anesthesia complications.  Patient was told that the likelihood that her condition and symptoms will be treated effectively with this surgical management was very high; the postoperative expectations were also discussed in detail. The patient also understands the alternative treatment options which were discussed in full. All  questions were answered.  She was told that she will be contacted by our surgical scheduler regarding the time and date of her surgery; routine preoperative instructions of having nothing to eat or drink after midnight on the day prior to surgery and also coming to the hospital 1 1/2 hours prior to her time of surgery were also emphasized.  She was told she may be called for a preoperative appointment about a week prior to surgery and will be given further preoperative instructions at that visit. Printed patient education handouts about the procedure were given to the patient to review at home.  Total face-to-face time with patient was 15 min.  Greater than 50% was spent in counseling and coordination of care with the patient.   Takelia Urieta L. Harraway-Smith, M.D., [facility]

## 2017-09-21 NOTE — Patient Instructions (Signed)
Dilation and Curettage or Vacuum Curettage Dilation and curettage (D&C) and vacuum curettage are minor procedures. A D&C involves stretching (dilation) the cervix and scraping (curettage) the inside lining of the uterus (endometrium). During a D&C, tissue is gently scraped from the endometrium, starting from the top portion of the uterus down to the lowest part of the uterus (cervix). During a vacuum curettage, the lining and tissue in the uterus are removed with the use of gentle suction. Curettage may be performed to either diagnose or treat a problem. As a diagnostic procedure, curettage is performed to examine tissues from the uterus. A diagnostic curettage may be done if you have:  Irregular bleeding in the uterus.  Bleeding with the development of clots.  Spotting between menstrual periods.  Prolonged menstrual periods or other abnormal bleeding.  Bleeding after menopause.  No menstrual period (amenorrhea).  A change in size and shape of the uterus.  Abnormal endometrial cells discovered during a Pap test.  As a treatment procedure, curettage may be performed for the following reasons:  Removal of an IUD (intrauterine device).  Removal of retained placenta after giving birth.  Abortion.  Miscarriage.  Removal of endometrial polyps.  Removal of uncommon types of noncancerous lumps (fibroids).  Tell a health care provider about:  Any allergies you have, including allergies to prescribed medicine or latex.  All medicines you are taking, including vitamins, herbs, eye drops, creams, and over-the-counter medicines. This is especially important if you take any blood-thinning medicine. Bring a list of all of your medicines to your appointment.  Any problems you or family members have had with anesthetic medicines.  Any blood disorders you have.  Any surgeries you have had.  Your medical history and any medical conditions you have.  Whether you are pregnant or may be  pregnant.  Recent vaginal infections you have had.  Recent menstrual periods, bleeding problems you have had, and what form of birth control (contraception) you use. What are the risks? Generally, this is a safe procedure. However, problems may occur, including:  Infection.  Heavy vaginal bleeding.  Allergic reactions to medicines.  Damage to the cervix or other structures or organs.  Development of scar tissue (adhesions) inside the uterus, which can cause abnormal amounts of menstrual bleeding. This may make it harder to get pregnant in the future.  A hole (perforation) or puncture in the uterine wall. This is rare.  What happens before the procedure? Staying hydrated Follow instructions from your health care provider about hydration, which may include:  Up to 2 hours before the procedure - you may continue to drink clear liquids, such as water, clear fruit juice, black coffee, and plain tea.  Eating and drinking restrictions Follow instructions from your health care provider about eating and drinking, which may include:  8 hours before the procedure - stop eating heavy meals or foods such as meat, fried foods, or fatty foods.  6 hours before the procedure - stop eating light meals or foods, such as toast or cereal.  6 hours before the procedure - stop drinking milk or drinks that contain milk.  2 hours before the procedure - stop drinking clear liquids. If your health care provider told you to take your medicine(s) on the day of your procedure, take them with only a sip of water.  Medicines  Ask your health care provider about: ? Changing or stopping your regular medicines. This is especially important if you are taking diabetes medicines or blood thinners. ? Taking   medicines such as aspirin and ibuprofen. These medicines can thin your blood. Do not take these medicines before your procedure if your health care provider instructs you not to.  You may be given antibiotic  medicine to help prevent infection. General instructions  For 24 hours before your procedure, do not: ? Douche. ? Use tampons. ? Use medicines, creams, or suppositories in the vagina. ? Have sexual intercourse.  You may be given a pregnancy test on the day of the procedure.  Plan to have someone take you home from the hospital or clinic.  You may have a blood or urine sample taken.  If you will be going home right after the procedure, plan to have someone with you for 24 hours. What happens during the procedure?  To reduce your risk of infection: ? Your health care team will wash or sanitize their hands. ? Your skin will be washed with soap.  An IV tube will be inserted into one of your veins.  You will be given one of the following: ? A medicine that numbs the area in and around the cervix (local anesthetic). ? A medicine to make you fall asleep (general anesthetic).  You will lie down on your back, with your feet in foot rests (stirrups).  The size and position of your uterus will be checked.  A lubricated instrument (speculum or Sims retractor) will be inserted into the back side of your vagina. The speculum will be used to hold apart the walls of your vagina so your health care provider can see your cervix.  A tool (tenaculum) will be attached to the lip of the cervix to stabilize it.  Your cervix will be softened and dilated. This may be done by: ? Taking a medicine. ? Having tapered dilators or thin rods (laminaria) or gradual widening instruments (tapered dilators) inserted into your cervix.  A small, sharp, curved instrument (curette) will be used to scrape a small amount of tissue or cells from the endometrium or cervical canal. In some cases, gentle suction is applied with the curette. The curette will then be removed. The cells will be taken to a lab for testing. The procedure may vary among health care providers and hospitals. What happens after the  procedure?  You may have mild cramping, backache, pain, and light bleeding or spotting. You may pass small blood clots from your vagina.  You may have to wear compression stockings. These stockings help to prevent blood clots and reduce swelling in your legs.  Your blood pressure, heart rate, breathing rate, and blood oxygen level will be monitored until the medicines you were given have worn off. Summary  Dilation and curettage (D&C) involves stretching (dilation) the cervix and scraping (curettage) the inside lining of the uterus (endometrium).  After the procedure, you may have mild cramping, backache, pain, and light bleeding or spotting. You may pass small blood clots from your vagina.  Plan to have someone take you home from the hospital or clinic. This information is not intended to replace advice given to you by your health care provider. Make sure you discuss any questions you have with your health care provider. Document Released: 04/18/2005 Document Revised: 01/03/2016 Document Reviewed: 01/03/2016 Elsevier Interactive Patient Education  2018 [employer] Miscarriage A miscarriage is the sudden loss of an unborn baby (fetus) before the 20th week of pregnancy. Most miscarriages happen in the first 3 months of pregnancy. Sometimes, it happens before a woman even knows she is pregnant. A miscarriage   is also called a "spontaneous miscarriage" or "early pregnancy loss." Having a miscarriage can be an emotional experience. Talk with your caregiver about any questions you may have about miscarrying, the grieving process, and your future pregnancy plans. What are the causes?  Problems with the fetal chromosomes that make it impossible for the baby to develop normally. Problems with the baby's genes or chromosomes are most often the result of errors that occur, by chance, as the embryo divides and grows. The problems are not inherited from the parents.  Infection of the cervix or  uterus.  Hormone problems.  Problems with the cervix, such as having an incompetent cervix. This is when the tissue in the cervix is not strong enough to hold the pregnancy.  Problems with the uterus, such as an abnormally shaped uterus, uterine fibroids, or congenital abnormalities.  Certain medical conditions.  Smoking, drinking alcohol, or taking illegal drugs.  Trauma. Often, the cause of a miscarriage is unknown. What are the signs or symptoms?  Vaginal bleeding or spotting, with or without cramps or pain.  Pain or cramping in the abdomen or lower back.  Passing fluid, tissue, or blood clots from the vagina. How is this diagnosed? Your caregiver will perform a physical exam. You may also have an ultrasound to confirm the miscarriage. Blood or urine tests may also be ordered. How is this treated?  Sometimes, treatment is not necessary if you naturally pass all the fetal tissue that was in the uterus. If some of the fetus or placenta remains in the body (incomplete miscarriage), tissue left behind may become infected and must be removed. Usually, a dilation and curettage (D and C) procedure is performed. During a D and C procedure, the cervix is widened (dilated) and any remaining fetal or placental tissue is gently removed from the uterus.  Antibiotic medicines are prescribed if there is an infection. Other medicines may be given to reduce the size of the uterus (contract) if there is a lot of bleeding.  If you have Rh negative blood and your baby was Rh positive, you will need a Rh immunoglobulin shot. This shot will protect any future baby from having Rh blood problems in future pregnancies. Follow these instructions at home:  Your caregiver may order bed rest or may allow you to continue light activity. Resume activity as directed by your caregiver.  Have someone help with home and family responsibilities during this time.  Keep track of the number of sanitary pads you use  each day and how soaked (saturated) they are. Write down this information.  Do not use tampons. Do not douche or have sexual intercourse until approved by your caregiver.  Only take over-the-counter or prescription medicines for pain or discomfort as directed by your caregiver.  Do not take aspirin. Aspirin can cause bleeding.  Keep all follow-up appointments with your caregiver.  If you or your partner have problems with grieving, talk to your caregiver or seek counseling to help cope with the pregnancy loss. Allow enough time to grieve before trying to get pregnant again. Get help right away if:  You have severe cramps or pain in your back or abdomen.  You have a fever.  You pass large blood clots (walnut-sized or larger) ortissue from your vagina. Save any tissue for your caregiver to inspect.  Your bleeding increases.  You have a thick, bad-smelling vaginal discharge.  You become lightheaded, weak, or you faint.  You have chills. This information is not intended to replace   advice given to you by your health care provider. Make sure you discuss any questions you have with your health care provider. Document Released: 10/12/2000 Document Revised: 09/24/2015 Document Reviewed: 06/07/2011 Elsevier Interactive Patient Education  2017 [employer]  

## 2017-09-22 ENCOUNTER — Ambulatory Visit (HOSPITAL_COMMUNITY)
Admission: RE | Admit: 2017-09-22 | Discharge: 2017-09-22 | Disposition: A | Discharge status: Home / Self Care | Payer: 59 | Source: Ambulatory Visit | Attending: Obstetrics and Gynecology | Admitting: Obstetrics and Gynecology

## 2017-09-22 ENCOUNTER — Encounter (HOSPITAL_COMMUNITY): Payer: Self-pay | Admitting: Anesthesiology

## 2017-09-22 ENCOUNTER — Other Ambulatory Visit: Payer: Self-pay

## 2017-09-22 ENCOUNTER — Ambulatory Visit (HOSPITAL_COMMUNITY): Payer: 59 | Admitting: Anesthesiology

## 2017-09-22 ENCOUNTER — Encounter: Payer: Self-pay | Admitting: Obstetrics & Gynecology

## 2017-09-22 ENCOUNTER — Encounter (HOSPITAL_COMMUNITY)
Admission: RE | Disposition: A | Discharge status: Home / Self Care | Payer: Self-pay | Source: Ambulatory Visit | Attending: Obstetrics and Gynecology

## 2017-09-22 DIAGNOSIS — O021 Missed abortion: Secondary | ICD-10-CM | POA: Insufficient documentation

## 2017-09-22 DIAGNOSIS — Z79899 Other long term (current) drug therapy: Secondary | ICD-10-CM | POA: Insufficient documentation

## 2017-09-22 HISTORY — DX: Herpesviral infection, unspecified: B00.9

## 2017-09-22 HISTORY — DX: Fracture of tooth (traumatic), initial encounter for closed fracture: S02.5XXA

## 2017-09-22 HISTORY — PX: DILATION AND EVACUATION: SHX1459

## 2017-09-22 LAB — CBC
HCT: 39.7 % (ref 36.0–46.0)
HEMOGLOBIN: 13.1 g/dL (ref 12.0–15.0)
MCH: 29.7 pg (ref 26.0–34.0)
MCHC: 33 g/dL (ref 30.0–36.0)
MCV: 90 fL (ref 78.0–100.0)
PLATELETS: 201 10*3/uL (ref 150–400)
RBC: 4.41 MIL/uL (ref 3.87–5.11)
RDW: 13.6 % (ref 11.5–15.5)
WBC: 7 10*3/uL (ref 4.0–10.5)

## 2017-09-22 SURGERY — DILATION AND EVACUATION, UTERUS
Anesthesia: Monitor Anesthesia Care

## 2017-09-22 MED ORDER — MIDAZOLAM HCL 2 MG/2ML IJ SOLN
INTRAMUSCULAR | Status: AC
  Filled 2017-09-22: qty 2

## 2017-09-22 MED ORDER — DOCUSATE SODIUM 100 MG PO CAPS: 100.0000 mg | ORAL_CAPSULE | Freq: Two times a day (BID) | ORAL | 2 refills | Status: DC | PRN

## 2017-09-22 MED ORDER — FENTANYL CITRATE (PF) 250 MCG/5ML IJ SOLN
INTRAMUSCULAR | Status: AC
  Filled 2017-09-22: qty 5

## 2017-09-22 MED ORDER — LIDOCAINE HCL (CARDIAC) PF 100 MG/5ML IV SOSY
PREFILLED_SYRINGE | INTRAVENOUS | Status: DC | PRN
  Administered 2017-09-22: 100 mg via INTRAVENOUS

## 2017-09-22 MED ORDER — OXYCODONE-ACETAMINOPHEN 5-325 MG PO TABS: 1.0000 | ORAL_TABLET | Freq: Four times a day (QID) | ORAL | 0 refills | Status: DC | PRN

## 2017-09-22 MED ORDER — HYDROCODONE-ACETAMINOPHEN 7.5-325 MG PO TABS
1.0000 | ORAL_TABLET | Freq: Once | ORAL | Status: AC | PRN
  Administered 2017-09-22: 1 via ORAL

## 2017-09-22 MED ORDER — FENTANYL CITRATE (PF) 100 MCG/2ML IJ SOLN
INTRAMUSCULAR | Status: AC
  Filled 2017-09-22: qty 2

## 2017-09-22 MED ORDER — SODIUM CHLORIDE 0.9 % IV SOLN
100.0000 mg | Freq: Once | INTRAVENOUS | Status: DC
  Filled 2017-09-22: qty 100

## 2017-09-22 MED ORDER — LIDOCAINE HCL (CARDIAC) PF 100 MG/5ML IV SOSY
PREFILLED_SYRINGE | INTRAVENOUS | Status: AC
  Filled 2017-09-22: qty 5

## 2017-09-22 MED ORDER — FENTANYL CITRATE (PF) 100 MCG/2ML IJ SOLN
INTRAMUSCULAR | Status: AC
  Administered 2017-09-22: 50 ug via INTRAVENOUS
  Filled 2017-09-22: qty 2

## 2017-09-22 MED ORDER — KETOROLAC TROMETHAMINE 30 MG/ML IJ SOLN
INTRAMUSCULAR | Status: AC
  Filled 2017-09-22: qty 1

## 2017-09-22 MED ORDER — SCOPOLAMINE 1 MG/3DAYS TD PT72
MEDICATED_PATCH | TRANSDERMAL | Status: AC
  Administered 2017-09-22: 1.5 mg via TRANSDERMAL
  Filled 2017-09-22: qty 1

## 2017-09-22 MED ORDER — MEPERIDINE HCL 25 MG/ML IJ SOLN: 6.2500 mg | INTRAMUSCULAR | Status: DC | PRN

## 2017-09-22 MED ORDER — CHLOROPROCAINE HCL 1 % IJ SOLN
INTRAMUSCULAR | Status: AC
  Filled 2017-09-22: qty 30

## 2017-09-22 MED ORDER — PROPOFOL 10 MG/ML IV BOLUS
INTRAVENOUS | Status: AC
  Filled 2017-09-22: qty 40

## 2017-09-22 MED ORDER — LACTATED RINGERS IV SOLN
INTRAVENOUS | Status: DC
  Administered 2017-09-22: 125 mL/h via INTRAVENOUS

## 2017-09-22 MED ORDER — SODIUM CHLORIDE 0.9 % IV SOLN: 100.0000 mg | Freq: Once | INTRAVENOUS | Status: AC

## 2017-09-22 MED ORDER — IBUPROFEN 600 MG PO TABS: 600.0000 mg | ORAL_TABLET | Freq: Four times a day (QID) | ORAL | 3 refills | Status: AC | PRN

## 2017-09-22 MED ORDER — ONDANSETRON HCL 4 MG/2ML IJ SOLN
INTRAMUSCULAR | Status: AC
  Filled 2017-09-22: qty 2

## 2017-09-22 MED ORDER — PROPOFOL 500 MG/50ML IV EMUL
INTRAVENOUS | Status: DC | PRN
  Administered 2017-09-22: 70 mg via INTRAVENOUS
  Administered 2017-09-22: 60 mg via INTRAVENOUS

## 2017-09-22 MED ORDER — HYDROCODONE-ACETAMINOPHEN 7.5-325 MG PO TABS
ORAL_TABLET | ORAL | Status: AC
  Filled 2017-09-22: qty 1

## 2017-09-22 MED ORDER — SODIUM CHLORIDE 0.9 % IV SOLN
INTRAVENOUS | Status: DC | PRN
  Administered 2017-09-22: 100 mg via INTRAVENOUS

## 2017-09-22 MED ORDER — CHLOROPROCAINE HCL 1 % IJ SOLN
INTRAMUSCULAR | Status: DC | PRN
  Administered 2017-09-22: 10 mL

## 2017-09-22 MED ORDER — PROPOFOL 500 MG/50ML IV EMUL
INTRAVENOUS | Status: DC | PRN
  Administered 2017-09-22: 75 ug/kg/min via INTRAVENOUS

## 2017-09-22 MED ORDER — DEXAMETHASONE SODIUM PHOSPHATE 10 MG/ML IJ SOLN
INTRAMUSCULAR | Status: DC | PRN
  Administered 2017-09-22: 4 mg via INTRAVENOUS

## 2017-09-22 MED ORDER — MIDAZOLAM HCL 2 MG/2ML IJ SOLN
INTRAMUSCULAR | Status: DC | PRN
  Administered 2017-09-22: 2 mg via INTRAVENOUS

## 2017-09-22 MED ORDER — METOCLOPRAMIDE HCL 5 MG/ML IJ SOLN: 10.0000 mg | Freq: Once | INTRAMUSCULAR | Status: DC | PRN

## 2017-09-22 MED ORDER — FENTANYL CITRATE (PF) 100 MCG/2ML IJ SOLN
25.0000 ug | INTRAMUSCULAR | Status: DC | PRN
  Administered 2017-09-22 (×2): 50 ug via INTRAVENOUS

## 2017-09-22 MED ORDER — SCOPOLAMINE 1 MG/3DAYS TD PT72
1.0000 | MEDICATED_PATCH | Freq: Once | TRANSDERMAL | Status: DC
  Administered 2017-09-22: 1.5 mg via TRANSDERMAL

## 2017-09-22 MED ORDER — FENTANYL CITRATE (PF) 100 MCG/2ML IJ SOLN
INTRAMUSCULAR | Status: DC | PRN
  Administered 2017-09-22: 100 ug via INTRAVENOUS

## 2017-09-22 MED ORDER — DEXAMETHASONE SODIUM PHOSPHATE 4 MG/ML IJ SOLN
INTRAMUSCULAR | Status: AC
  Filled 2017-09-22: qty 1

## 2017-09-22 MED ORDER — ONDANSETRON HCL 4 MG/2ML IJ SOLN
INTRAMUSCULAR | Status: DC | PRN
  Administered 2017-09-22: 4 mg via INTRAVENOUS

## 2017-09-22 MED ORDER — MIDAZOLAM HCL 2 MG/2ML IJ SOLN
INTRAMUSCULAR | Status: AC
Start: 2017-09-22 — End: ?
  Filled 2017-09-22: qty 2

## 2017-09-22 SURGICAL SUPPLY — 20 items
CATH ROBINSON RED A/P 16FR (CATHETERS) ×3 IMPLANT
DECANTER SPIKE VIAL GLASS SM (MISCELLANEOUS) ×3 IMPLANT
GLOVE BIOGEL PI IND STRL 6.5 (GLOVE) ×1 IMPLANT
GLOVE BIOGEL PI IND STRL 7.0 (GLOVE) ×1 IMPLANT
GLOVE BIOGEL PI INDICATOR 6.5 (GLOVE) ×2
GLOVE BIOGEL PI INDICATOR 7.0 (GLOVE) ×2
GLOVE SURG SS PI 6.0 STRL IVOR (GLOVE) ×3 IMPLANT
GOWN STRL REUS W/TWL LRG LVL3 (GOWN DISPOSABLE) ×6 IMPLANT
KIT BERKELEY 1ST TRIMESTER 3/8 (MISCELLANEOUS) ×3 IMPLANT
NS IRRIG 1000ML POUR BTL (IV SOLUTION) ×3 IMPLANT
PACK VAGINAL MINOR WOMEN LF (CUSTOM PROCEDURE TRAY) ×3 IMPLANT
PAD OB MATERNITY 4.3X12.25 (PERSONAL CARE ITEMS) ×3 IMPLANT
PAD PREP 24X48 CUFFED NSTRL (MISCELLANEOUS) ×3 IMPLANT
SET BERKELEY SUCTION TUBING (SUCTIONS) ×3 IMPLANT
TOWEL OR 17X24 6PK STRL BLUE (TOWEL DISPOSABLE) ×6 IMPLANT
VACURETTE 10 RIGID CVD (CANNULA) IMPLANT
VACURETTE 6 ASPIR F TIP BERK (CANNULA) ×3 IMPLANT
VACURETTE 7MM CVD STRL WRAP (CANNULA) IMPLANT
VACURETTE 8 RIGID CVD (CANNULA) IMPLANT
VACURETTE 9 RIGID CVD (CANNULA) IMPLANT

## 2017-09-22 NOTE — Addendum Note (Signed)
Addendum  created 09/22/17 1249 by Elgie Congo, CRNA   Intraprocedure Flowsheets edited

## 2017-09-22 NOTE — Discharge Instructions (Signed)
DISCHARGE INSTRUCTIONS: D&C / D&E °The following instructions have been prepared to help you care for yourself upon your return home. °  °Personal hygiene: °• Use sanitary pads for vaginal drainage, not tampons. °• Shower the day after your procedure. °• NO tub baths, pools or Jacuzzis for 2-3 weeks. °• Wipe front to back after using the bathroom. ° °Activity and limitations: °• For the next 24 hours, DO NOT: °-Drive a car °-Operate machinery °-Drink alcoholic beverages °-Take any medication unless instructed by your physician °-Make any legal decisions or sign important papers. °• Do NOT rest in bed all day. °• Walking is encouraged. °• Walk up and down stairs slowly. °• You may resume your normal activity in one to two days or as indicated by your physician. ° °Sexual activity: NO intercourse for at least 2 weeks after the procedure, or as indicated by your physician. ° °Diet: Eat a light meal as desired this evening. You may resume your usual diet tomorrow. ° °Return to work: You may resume your work activities in one to two days or as indicated by your doctor. ° °What to expect after your surgery: Expect to have vaginal bleeding/discharge for 2-3 days and spotting for up to 10 days. It is not unusual to have soreness for up to 1-2 weeks. You may have a slight burning sensation when you urinate for the first day. Mild cramps may continue for a couple of days. You may have a regular period in 2-6 weeks. ° °Call your doctor for any of the following: °• Excessive vaginal bleeding, saturating and changing one pad every hour. °• Inability to urinate 6 hours after discharge from hospital. °• Pain not relieved by pain medication. °• Fever of 100.4° F or greater. °• Unusual vaginal discharge or odor. ° °Special Instructions/Symptoms: °Your throat may feel dry or sore from the anesthesia or the breathing tube placed in your throat during surgery. If this causes discomfort, gargle with warm salt water. The discomfort  should disappear within 24 hours. ° °If you had a scopolamine patch placed behind your ear for the management of post- operative nausea and/or vomiting: ° °1. The medication in the patch is effective for 72 hours, after which it should be removed.  Wrap patch in a tissue and discard in the trash. Wash hands thoroughly with soap and water. °2. You may remove the patch earlier than 72 hours if you experience unpleasant side effects which may include dry mouth, dizziness or visual disturbances. °3. Avoid touching the patch. Wash your hands with soap and water after contact with the patch. °  °Dilation and Curettage or Vacuum Curettage °Dilation and curettage (D&C) and vacuum curettage are minor procedures. A D&C involves stretching (dilation) the cervix and scraping (curettage) the inside lining of the uterus (endometrium). During a D&C, tissue is gently scraped from the endometrium, starting from the top portion of the uterus down to the lowest part of the uterus (cervix). During a vacuum curettage, the lining and tissue in the uterus are removed with the use of gentle suction. °Curettage may be performed to either diagnose or treat a problem. As a diagnostic procedure, curettage is performed to examine tissues from the uterus. A diagnostic curettage may be done if you have: °· Irregular bleeding in the uterus. °· Bleeding with the development of clots. °· Spotting between menstrual periods. °· Prolonged menstrual periods or other abnormal bleeding. °· Bleeding after menopause. °· No menstrual period (amenorrhea). °· A change in size and shape   of the uterus. °· Abnormal endometrial cells discovered during a Pap test. ° °As a treatment procedure, curettage may be performed for the following reasons: °· Removal of an IUD (intrauterine device). °· Removal of retained placenta after giving birth. °· Abortion. °· Miscarriage. °· Removal of endometrial polyps. °· Removal of uncommon types of noncancerous lumps  (fibroids). ° °Tell a health care provider about: °· Any allergies you have, including allergies to prescribed medicine or latex. °· All medicines you are taking, including vitamins, herbs, eye drops, creams, and over-the-counter medicines. This is especially important if you take any blood-thinning medicine. Bring a list of all of your medicines to your appointment. °· Any problems you or family members have had with anesthetic medicines. °· Any blood disorders you have. °· Any surgeries you have had. °· Your medical history and any medical conditions you have. °· Whether you are pregnant or may be pregnant. °· Recent vaginal infections you have had. °· Recent menstrual periods, bleeding problems you have had, and what form of birth control (contraception) you use. °What are the risks? °Generally, this is a safe procedure. However, problems may occur, including: °· Infection. °· Heavy vaginal bleeding. °· Allergic reactions to medicines. °· Damage to the cervix or other structures or organs. °· Development of scar tissue (adhesions) inside the uterus, which can cause abnormal amounts of menstrual bleeding. This may make it harder to get pregnant in the future. °· A hole (perforation) or puncture in the uterine wall. This is rare. ° °What happens before the procedure? °Staying hydrated °Follow instructions from your health care provider about hydration, which may include: °· Up to 2 hours before the procedure - you may continue to drink clear liquids, such as water, clear fruit juice, black coffee, and plain tea. ° °Eating and drinking restrictions °Follow instructions from your health care provider about eating and drinking, which may include: °· 8 hours before the procedure - stop eating heavy meals or foods such as meat, fried foods, or fatty foods. °· 6 hours before the procedure - stop eating light meals or foods, such as toast or cereal. °· 6 hours before the procedure - stop drinking milk or drinks that  contain milk. °· 2 hours before the procedure - stop drinking clear liquids. If your health care provider told you to take your medicine(s) on the day of your procedure, take them with only a sip of water. ° °Medicines °· Ask your health care provider about: °? Changing or stopping your regular medicines. This is especially important if you are taking diabetes medicines or blood thinners. °? Taking medicines such as aspirin and ibuprofen. These medicines can thin your blood. Do not take these medicines before your procedure if your health care provider instructs you not to. °· You may be given antibiotic medicine to help prevent infection. °General instructions °· For 24 hours before your procedure, do not: °? Douche. °? Use tampons. °? Use medicines, creams, or suppositories in the vagina. °? Have sexual intercourse. °· You may be given a pregnancy test on the day of the procedure. °· Plan to have someone take you home from the hospital or clinic. °· You may have a blood or urine sample taken. °· If you will be going home right after the procedure, plan to have someone with you for 24 hours. °What happens during the procedure? °· To reduce your risk of infection: °? Your health care team will wash or sanitize their hands. °? Your skin will   be washed with soap. °· An IV tube will be inserted into one of your veins. °· You will be given one of the following: °? A medicine that numbs the area in and around the cervix (local anesthetic). °? A medicine to make you fall asleep (general anesthetic). °· You will lie down on your back, with your feet in foot rests (stirrups). °· The size and position of your uterus will be checked. °· A lubricated instrument (speculum or Sims retractor) will be inserted into the back side of your vagina. The speculum will be used to hold apart the walls of your vagina so your health care provider can see your cervix. °· A tool (tenaculum) will be attached to the lip of the cervix to  stabilize it. °· Your cervix will be softened and dilated. This may be done by: °? Taking a medicine. °? Having tapered dilators or thin rods (laminaria) or gradual widening instruments (tapered dilators) inserted into your cervix. °· A small, sharp, curved instrument (curette) will be used to scrape a small amount of tissue or cells from the endometrium or cervical canal. In some cases, gentle suction is applied with the curette. The curette will then be removed. The cells will be taken to a lab for testing. °The procedure may vary among health care providers and hospitals. °What happens after the procedure? °· You may have mild cramping, backache, pain, and light bleeding or spotting. You may pass small blood clots from your vagina. °· You may have to wear compression stockings. These stockings help to prevent blood clots and reduce swelling in your legs. °· Your blood pressure, heart rate, breathing rate, and blood oxygen level will be monitored until the medicines you were given have worn off. °Summary °· Dilation and curettage (D&C) involves stretching (dilation) the cervix and scraping (curettage) the inside lining of the uterus (endometrium). °· After the procedure, you may have mild cramping, backache, pain, and light bleeding or spotting. You may pass small blood clots from your vagina. °· Plan to have someone take you home from the hospital or clinic. °This information is not intended to replace advice given to you by your health care provider. Make sure you discuss any questions you have with your health care provider. °Document Released: 04/18/2005 Document Revised: 01/03/2016 Document Reviewed: 01/03/2016 °Elsevier Interactive Patient Education © 2018 [employer] °

## 2017-09-22 NOTE — Anesthesia Preprocedure Evaluation (Addendum)
Anesthesia Evaluation  Patient identified by MRN, date of birth, ID band Patient awake    Reviewed: Allergy & Precautions, NPO status , Patient's Chart, lab work & pertinent test results  Airway Mallampati: II  TM Distance: >3 FB Neck ROM: Full    Dental  (+) Chipped,    Pulmonary neg pulmonary ROS,    Pulmonary exam normal breath sounds clear to auscultation       Cardiovascular negative cardio ROS Normal cardiovascular exam Rhythm:Regular Rate:Normal     Neuro/Psych negative neurological ROS  negative psych ROS   GI/Hepatic negative GI ROS, Neg liver ROS,   Endo/Other  negative endocrine ROS  Renal/GU negative Renal ROS  negative genitourinary   Musculoskeletal negative musculoskeletal ROS (+)   Abdominal   Peds  Hematology  (+) Blood dyscrasia, , Von Willebrand's disease Type I - responsive to dDAVP Worked up by Hematology in [place]   Anesthesia Other Findings   Reproductive/Obstetrics (+) Pregnancy Missed Ab HSV                           Anesthesia Physical Anesthesia Plan  ASA: II  Anesthesia Plan: MAC   Post-op Pain Management:    Induction: Intravenous  PONV Risk Score and Plan: Midazolam, Dexamethasone, Ondansetron, Propofol infusion and Treatment may vary due to age or medical condition  Airway Management Planned: Natural Airway, Nasal Cannula and Simple Face Mask  Additional Equipment:   Intra-op Plan:   Post-operative Plan:   Informed Consent: I have reviewed the patients History and Physical, chart, labs and discussed the procedure including the risks, benefits and alternatives for the proposed anesthesia with the patient or authorized representative who has indicated his/her understanding and acceptance.   Dental advisory given  Plan Discussed with: CRNA and Surgeon  Anesthesia Plan Comments:         Anesthesia Quick Evaluation

## 2017-09-22 NOTE — Anesthesia Postprocedure Evaluation (Signed)
Anesthesia Post Note  Patient: Ethleen Muff  Procedure(s) Performed: DILATATION AND EVACUATION (N/A )     Patient location during evaluation: PACU Anesthesia Type: MAC Level of consciousness: awake and alert and oriented Pain management: pain level controlled Vital Signs Assessment: post-procedure vital signs reviewed and stable Respiratory status: spontaneous breathing, nonlabored ventilation and respiratory function stable Cardiovascular status: stable and blood pressure returned to baseline Postop Assessment: no apparent nausea or vomiting Anesthetic complications: no    Last Vitals:  Vitals:   09/22/17 1125 09/22/17 1130  BP:  107/72  Pulse: 89 88  Resp: 18 (!) 21  Temp:    SpO2: 100% 92%    Last Pain:  Vitals:   09/22/17 1130  TempSrc:   PainSc: 2    Pain Goal: Patients Stated Pain Goal: 5 (09/22/17 1130)               Qamar Aughenbaugh A.

## 2017-09-22 NOTE — H&P (Addendum)
Jessica Rojas is an 27 y.o. female G22P0030 with first trimester miscarriage here for surgical management. Patient with 5 week missed abortion, no cardiac activity seen since 09/01/2017 with abnormal rise in HCG. Patient was seen in the office yesterday and office ultrasound confirmed a failed pregnancy. Patient is without complaints and denies cramping or vaginal bleeding.     Menstrual History: Patient's last menstrual period was 07/17/2017.    Past Medical History:  Diagnosis Date  . Broken tooth    lower bottom back left  . HSV infection   . Von Willebrand disease (HCC)     Past Surgical History:  Procedure Laterality Date  . INDUCED ABORTION    . TONSILLECTOMY      Family History  Problem Relation Age of Onset  . Hypertension Mother   . Cancer Neg Hx   . Stroke Neg Hx   . Diabetes Neg Hx     Social History:  reports that she has never smoked. She has never used smokeless tobacco. She reports that she does not drink alcohol or use drugs.  Allergies:  Allergies  Allergen Reactions  . Tramadol Itching    Medications Prior to Admission  Medication Sig Dispense Refill Last Dose  . Prenatal Vit-Fe Fumarate-FA (PRENATAL COMPLETE) 14-0.4 MG TABS Take one pill daily. (Patient taking differently: Take 1 tablet by mouth daily. ) 60 each 0 Past Week at Unknown time  . triamcinolone cream (KENALOG) 0.1 % Apply 1 application topically 2 (two) times daily.   Past Week at Unknown time    ROS See pertinent in HPI Blood pressure 95/65, pulse 85, temperature 99.2 F (37.3 C), temperature source Oral, resp. rate 16, height  (1.575 m), weight 154 lb (69.9 kg), last menstrual period 07/17/2017, SpO2 100 %. Physical Exam GENERAL: Well-developed, well-nourished female in no acute distress.  LUNGS: Clear to auscultation bilaterally.  HEART: Regular rate and rhythm. ABDOMEN: Soft, nontender, nondistended. No organomegaly. PELVIC: Deferred to OR EXTREMITIES: No cyanosis, clubbing,  or edema, 2+ distal pulses.  Results for orders placed or performed during the hospital encounter of 09/22/17 (from the past 24 hour(s))  CBC     Status: None   Collection Time: 09/22/17  8:59 AM  Result Value Ref Range   WBC 7.0 4.0 - 10.5 K/uL   RBC 4.41 3.87 - 5.11 MIL/uL   Hemoglobin 13.1 12.0 - 15.0 g/dL   HCT 16.1 09.6 - 04.5 %   MCV 90.0 78.0 - 100.0 fL   MCH 29.7 26.0 - 34.0 pg   MCHC 33.0 30.0 - 36.0 g/dL   RDW 40.9 81.1 - 91.4 %   Platelets 201 150 - 400 K/uL    No results found.   Assessment/Plan: 27 yo G4P0030 with first trimester missed abortion here for dilatation and evacuation - Risks, benefits and alternatives were explained including but not limited to risks of bleeding, infection and damage to adjacent organs. Patient verbalized understanding and all questions were answered  Ethanael Veith 09/22/2017, 9:24 AM

## 2017-09-22 NOTE — Transfer of Care (Signed)
Immediate Anesthesia Transfer of Care Note  Patient: Jessica Rojas  Procedure(s) Performed: DILATATION AND EVACUATION (N/A )  Patient Location: PACU  Anesthesia Type:MAC  Level of Consciousness: awake, alert  and oriented  Airway & Oxygen Therapy: Patient Spontanous Breathing and Patient connected to nasal cannula oxygen  Post-op Assessment: Report given to RN, Post -op Vital signs reviewed and stable and Patient moving all extremities  Post vital signs: Reviewed and stable  Last Vitals:  Vitals Value Taken Time  BP    Temp    Pulse    Resp    SpO2      Last Pain:  Vitals:   09/22/17 0855  TempSrc: Oral      Patients Stated Pain Goal: 5 (78/37/54 2370)  Complications: No apparent anesthesia complications

## 2017-09-22 NOTE — Op Note (Signed)
[facility] PROCEDURE DATE: 09/22/2017  PREOPERATIVE DIAGNOSIS: 5 week missed abortion. POSTOPERATIVE DIAGNOSIS: The same. PROCEDURE:     Dilation and Evacuation. SURGEON:  Dr. Catalina Antigua  INDICATIONS: 27 y.o. G4P0030 with MAB at [redacted] weeks gestation, needing surgical completion.  Risks of surgery were discussed with the patient including but not limited to: bleeding which may require transfusion; infection which may require antibiotics; injury to uterus or surrounding organs;need for additional procedures including laparotomy or laparoscopy; possibility of intrauterine scarring which may impair future fertility; and other postoperative/anesthesia complications. Written informed consent was obtained.    FINDINGS:  A 6-week size anteverted uterus, moderate amounts of products of conception, specimen sent to pathology.  ANESTHESIA:    Monitored intravenous sedation, paracervical block. INTRAVENOUS FLUIDS:  400 ml of LR ESTIMATED BLOOD LOSS:  Less than 20 ml. SPECIMENS:  Products of conception sent to pathology COMPLICATIONS:  None immediate.  PROCEDURE DETAILS:  The patient received intravenous antibiotics while in the preoperative area.  She was then taken to the operating room where general anesthesia was administered and was found to be adequate.  After an adequate timeout was performed, she was placed in the dorsal lithotomy position and examined; then prepped and draped in the sterile manner.   Her bladder was catheterized for an unmeasured amount of clear, yellow urine. A vaginal speculum was then placed in the patient's vagina and a single tooth tenaculum was applied to the anterior lip of the cervix.  A paracervical block using 0.5% Marcaine was administered. The cervix was gently dilated to accommodate a 6 mm suction curette that was gently advanced to the uterine fundus.  The suction device was then activated and curette slowly rotated to clear the uterus of products of conception.  A  sharp curettage was then performed to confirm complete emptying of the uterus. There was minimal bleeding noted and the tenaculum removed with good hemostasis noted.   All instruments were removed from the patient's vagina. The patient tolerated the procedure well and was taken to the recovery area awake, and in stable condition.  The patient will be discharged to home as per PACU criteria.  Routine postoperative instructions given.  She was prescribed Percocet, Ibuprofen and Colace.  She will follow up in the clinic in 2 weeks for postoperative evaluation.

## 2017-09-23 ENCOUNTER — Encounter (HOSPITAL_COMMUNITY): Payer: Self-pay | Admitting: Obstetrics and Gynecology

## 2017-09-26 ENCOUNTER — Encounter: Payer: Self-pay | Admitting: Family Medicine

## 2017-09-26 ENCOUNTER — Other Ambulatory Visit: Payer: Self-pay

## 2017-09-26 ENCOUNTER — Emergency Department (HOSPITAL_BASED_OUTPATIENT_CLINIC_OR_DEPARTMENT_OTHER)
Admission: EM | Admit: 2017-09-26 | Discharge: 2017-09-27 | Disposition: A | Discharge status: Home / Self Care | Payer: PRIVATE HEALTH INSURANCE | Attending: Emergency Medicine | Admitting: Emergency Medicine

## 2017-09-26 ENCOUNTER — Encounter (HOSPITAL_BASED_OUTPATIENT_CLINIC_OR_DEPARTMENT_OTHER): Payer: Self-pay | Admitting: Emergency Medicine

## 2017-09-26 DIAGNOSIS — F32A Depression, unspecified: Secondary | ICD-10-CM

## 2017-09-26 DIAGNOSIS — Z8759 Personal history of other complications of pregnancy, childbirth and the puerperium: Secondary | ICD-10-CM | POA: Diagnosis not present

## 2017-09-26 DIAGNOSIS — Z79899 Other long term (current) drug therapy: Secondary | ICD-10-CM | POA: Diagnosis not present

## 2017-09-26 DIAGNOSIS — F329 Major depressive disorder, single episode, unspecified: Secondary | ICD-10-CM

## 2017-09-26 LAB — TYPE AND SCREEN
ABO/RH(D): B NEG
Antibody Screen: POSITIVE
Unit division: 0
Unit division: 0

## 2017-09-26 LAB — BPAM RBC
BLOOD PRODUCT EXPIRATION DATE: 201906272359
Blood Product Expiration Date: 201906272359
UNIT TYPE AND RH: 9500
UNIT TYPE AND RH: 9500
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
## 2017-09-27 ENCOUNTER — Ambulatory Visit (INDEPENDENT_AMBULATORY_CARE_PROVIDER_SITE_OTHER): Payer: 59 | Admitting: Obstetrics & Gynecology

## 2017-09-27 ENCOUNTER — Telehealth: Payer: Self-pay

## 2017-09-27 ENCOUNTER — Encounter: Payer: Self-pay | Admitting: Obstetrics & Gynecology

## 2017-09-27 VITALS — BP 99/62 | HR 83 | Ht 62.0 in | Wt 152.4 lb

## 2017-09-27 DIAGNOSIS — F329 Major depressive disorder, single episode, unspecified: Secondary | ICD-10-CM | POA: Insufficient documentation

## 2017-09-27 DIAGNOSIS — G8918 Other acute postprocedural pain: Secondary | ICD-10-CM

## 2017-09-27 DIAGNOSIS — Z9889 Other specified postprocedural states: Secondary | ICD-10-CM

## 2017-09-27 LAB — CBC WITH DIFFERENTIAL/PLATELET
Basophils Absolute: 0 10*3/uL (ref 0.0–0.1)
Basophils Relative: 0 %
EOS ABS: 0 10*3/uL (ref 0.0–0.7)
EOS PCT: 0 %
HCT: 38.5 % (ref 36.0–46.0)
Hemoglobin: 13.3 g/dL (ref 12.0–15.0)
LYMPHS ABS: 2.4 10*3/uL (ref 0.7–4.0)
Lymphocytes Relative: 22 %
MCH: 30.5 pg (ref 26.0–34.0)
MCHC: 34.5 g/dL (ref 30.0–36.0)
MCV: 88.3 fL (ref 78.0–100.0)
MONO ABS: 0.6 10*3/uL (ref 0.1–1.0)
MONOS PCT: 5 %
Neutro Abs: 8.1 10*3/uL — ABNORMAL HIGH (ref 1.7–7.7)
Neutrophils Relative %: 73 %
PLATELETS: 244 10*3/uL (ref 150–400)
RBC: 4.36 MIL/uL (ref 3.87–5.11)
RDW: 12.8 % (ref 11.5–15.5)
WBC: 11.2 10*3/uL — ABNORMAL HIGH (ref 4.0–10.5)

## 2017-09-27 LAB — RAPID URINE DRUG SCREEN, HOSP PERFORMED
Amphetamines: NOT DETECTED
BARBITURATES: NOT DETECTED
BENZODIAZEPINES: NOT DETECTED
Cocaine: NOT DETECTED
Opiates: NOT DETECTED
Tetrahydrocannabinol: POSITIVE — AB

## 2017-09-27 LAB — URINALYSIS, ROUTINE W REFLEX MICROSCOPIC
BILIRUBIN URINE: NEGATIVE
GLUCOSE, UA: NEGATIVE mg/dL
KETONES UR: 15 mg/dL — AB
Leukocytes, UA: NEGATIVE
Nitrite: NEGATIVE
PROTEIN: NEGATIVE mg/dL
Specific Gravity, Urine: 1.025 (ref 1.005–1.030)
pH: 6 (ref 5.0–8.0)

## 2017-09-27 LAB — BASIC METABOLIC PANEL
Anion gap: 8 (ref 5–15)
BUN: 7 mg/dL (ref 6–20)
CHLORIDE: 107 mmol/L (ref 101–111)
CO2: 24 mmol/L (ref 22–32)
CREATININE: 0.68 mg/dL (ref 0.44–1.00)
Calcium: 9 mg/dL (ref 8.9–10.3)
GFR calc Af Amer: >60 mL/min (ref >60)
GFR calc non Af Amer: >60 mL/min (ref >60)
Glucose, Bld: 96 mg/dL (ref 65–99)
Potassium: 3.9 mmol/L (ref 3.5–5.1)
Sodium: 139 mmol/L (ref 135–145)

## 2017-09-27 LAB — URINALYSIS, MICROSCOPIC (REFLEX)

## 2017-09-27 LAB — ETHANOL

## 2017-09-27 LAB — PREGNANCY, URINE: Preg Test, Ur: POSITIVE — AB

## 2017-09-27 MED ORDER — IBUPROFEN 800 MG PO TABS
800.0000 mg | ORAL_TABLET | Freq: Once | ORAL | Status: AC
  Administered 2017-09-27: 800 mg via ORAL
  Filled 2017-09-27: qty 1

## 2017-09-27 NOTE — ED Notes (Signed)
ED Provider at bedside. 
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
## 2017-09-27 NOTE — Telephone Encounter (Signed)
Patient called stating that she is still having lots of pain from her D&C on 09-22-17.  Patient also complaining of the tip of her tongue feeling numb since surgery. Patient states that she went to the hospital yesterday because she felt like she was having a nervous break down. Patient instructed to come in today for evaluation at 3:30 pm with Dr. Erin Fulling. Armandina Stammer RN

## 2017-09-27 NOTE — ED Provider Notes (Signed)
[facility] EMERGENCY DEPARTMENT Provider Note   CSN: [id number] Arrival date & time: 09/26/17  2018     History   Chief Complaint Chief Complaint  Patient presents with  . Mental Health Problem    HPI Jessica Rojas is a 27 y.o. female.  Patient is a 27 year old female with past medical history of depression and recent miscarriage requiring D&C.  She presents today with complaints of depression and feeling as though she is having a "nervous breakdown".  She feels as though her miscarriage is been very stressful and causing her to feel worse.  She denies to me that she is having any suicidal or homicidal ideation.  She denies any auditory or visual hallucination.  The history is provided by the patient.  Mental Health Problem  Presenting symptoms: depression   Patient accompanied by:  Family member (Sister) Degree of incapacity (severity):  Moderate Onset quality:  Gradual Duration:  1 week Timing:  Constant Progression:  Worsening Chronicity:  Recurrent Context: stressful life event   Context: not alcohol use and not drug abuse   Relieved by:  Nothing Worsened by:  Nothing   Past Medical History:  Diagnosis Date  . Broken tooth    lower bottom back left  . HSV infection   . [facility]     Patient Active Problem List   Diagnosis Date Noted  . Missed abortion   . Von Willebrand disease (HCC) 09/21/2017    Past Surgical History:  Procedure Laterality Date  . DILATION AND EVACUATION N/A 09/22/2017   Procedure: DILATATION AND EVACUATION;  Surgeon: Catalina Antigua, MD;  Location: [facility];  Service: Gynecology;  Laterality: N/A;  . INDUCED ABORTION    . TONSILLECTOMY       OB History    Gravida  4   Para      Term      Preterm      AB  3   Living  0     SAB      TAB  3   Ectopic      Multiple      Live Births               Home Medications    Prior to Admission medications   Medication Sig Start Date End  Date Taking? Authorizing Provider  docusate sodium (COLACE) 100 MG capsule Take 1 capsule (100 mg total) by mouth 2 (two) times daily as needed. 09/22/17   Constant, Peggy, MD  ibuprofen (ADVIL,MOTRIN) 600 MG tablet Take 1 tablet (600 mg total) by mouth every 6 (six) hours as needed. 09/22/17   Constant, Peggy, MD  oxyCODONE-acetaminophen (PERCOCET/ROXICET) 5-325 MG tablet Take 1-2 tablets by mouth every 6 (six) hours as needed. 09/22/17   Constant, Peggy, MD  Prenatal Vit-Fe Fumarate-FA (PRENATAL COMPLETE) 14-0.4 MG TABS Take one pill daily. Patient taking differently: Take 1 tablet by mouth daily.  08/29/17   Maczis, Elmer Sow, PA-C  triamcinolone cream (KENALOG) 0.1 % Apply 1 application topically 2 (two) times daily.    [provider]    Family History Family History  Problem Relation Age of Onset  . Hypertension Mother   . Cancer Neg Hx   . Stroke Neg Hx   . Diabetes Neg Hx     Social History Social History   Tobacco Use  . Smoking status: Never Smoker  . Smokeless tobacco: Never Used  Substance Use Topics  . Alcohol use: No  . Drug  use: No     Allergies   Tramadol   Review of Systems Review of Systems  All other systems reviewed and are negative.    Physical Exam Updated Vital Signs BP 119/90 (BP Location: Right Arm)   Pulse 72   Temp 99 F (37.2 C) (Oral)   Resp 18   Ht  (1.575 m)   Wt 69.9 kg (154 lb)   LMP 07/17/2017   SpO2 99%   Breastfeeding? Unknown   BMI 28.17 kg/m   Physical Exam  Constitutional: She is oriented to person, place, and time. She appears well-developed and well-nourished. No distress.  HENT:  Head: Normocephalic and atraumatic.  Mouth/Throat: Oropharynx is clear and moist.  Neck: Normal range of motion. Neck supple.  Cardiovascular: Normal rate and regular rhythm. Exam reveals no gallop and no friction rub.  No murmur heard. Pulmonary/Chest: Effort normal and breath sounds normal. No respiratory distress. She has no  wheezes.  Abdominal: Soft. Bowel sounds are normal. She exhibits no distension. There is no tenderness.  Musculoskeletal: Normal range of motion.  Neurological: She is alert and oriented to person, place, and time.  Skin: Skin is warm and dry. She is not diaphoretic.  Nursing note and vitals reviewed.    ED Treatments / Results  Labs (all labs ordered are listed, but only abnormal results are displayed) Labs Reviewed - No data to display  EKG None  Radiology No results found.  Procedures Procedures (including critical care time)  Medications Ordered in ED Medications - No data to display   Initial Impression / Assessment and Plan / ED Course  I have reviewed the triage vital signs and the nursing notes.  Pertinent labs & imaging results that were available during my care of the patient were reviewed by me and considered in my medical decision making (see chart for details).  Patient seen by TTS who does not feel as though the patient meets inpatient criteria.  She will be discharged with outpatient resources and PRN return.  Final Clinical Impressions(s) / ED Diagnoses   Final diagnoses:  None    ED Discharge Orders    None       Geoffery Lyons, MD 09/27/17 [phone]

## 2017-09-27 NOTE — Discharge Instructions (Signed)
Follow-up with outpatient counseling as recommended by the crisis worker.

## 2017-09-27 NOTE — Progress Notes (Signed)
History:  27 y.o. G4P0030 here today for pain after a D&C. Pt denies fever or chills. She reports an episode of depression last pm. Her mother took her to the ED. She reports that she has several things going on at this time that are causing her stress. She has not had a BM since the procedure and she usually has a stool daily.      The following portions of the patient's history were reviewed and updated as appropriate: allergies, current medications, past family history, past medical history, past social history, past surgical history and problem list.  Review of Systems:  Pertinent items are noted in HPI.    Objective:  Physical Exam Blood pressure 99/62, pulse 83, height  (1.575 m), weight 152 lb 6.4 oz (69.1 kg), last menstrual period 07/17/2017, unknown if currently breastfeeding.  CONSTITUTIONAL: Well-developed, well-nourished female in no acute distress.  HENT:  Normocephalic, atraumatic EYES: Conjunctivae and EOM are normal. No scleral icterus.  NECK: Normal range of motion SKIN: Skin is warm and dry. No rash noted. Not diaphoretic.No pallor. NEUROLGIC: Alert and oriented to person, place, and time. Normal coordination.  Abd: Soft, nontender and nondistended Pelvic: deferred  Labs and Imaging US Ob Comp < 14 Wks  Result Date: 08/29/2017 CLINICAL DATA:  Vaginal bleeding. EXAM: OBSTETRIC <14 WK [place] AND TRANSVAGINAL OB US TECHNIQUE: Both transabdominal and transvaginal ultrasound examinations were performed for complete evaluation of the gestation as well as the maternal uterus, adnexal regions, and pelvic cul-de-sac. Transvaginal technique was performed to assess early pregnancy. COMPARISON:  None. FINDINGS: Intrauterine gestational sac: Single Yolk sac:  Not Visualized. Embryo:  Not Visualized. Cardiac Activity: Not Visualized. MSD: 5 mm   5 w   2 d Maternal uterus/adnexae: Subchorionic hemorrhage: None visualized. Right ovary: Normal Left ovary: Small heterogeneous structure  within the left ovary measures 1.1 x 0.6 x 0.6 cm. Other :None Free fluid:  None IMPRESSION: 1. Probable early intrauterine gestational sac, but no yolk sac, fetal pole, or cardiac activity yet visualized. Recommend follow-up quantitative B-HCG levels and follow-up US in 14 days to assess viability. This recommendation follows SRU consensus guidelines: Diagnostic Criteria for Nonviable Pregnancy Early in the First Trimester. Malva Limes Med 2013; 696:2952-84. 2. Small heterogeneous solid-appearing structure within the left ovary measuring 11 mm. Indeterminate. Attention on follow-up imaging advise. Electronically Signed   By: Signa Kell M.D.   On: 08/29/2017 17:11   US Ob Transvaginal  Result Date: 09/01/2017 CLINICAL DATA:  27 year old female with spotting. LMP: 07/17/2017 corresponding to an estimated gestational age of [redacted] weeks, 4 days. EXAM: TRANSVAGINAL OB ULTRASOUND TECHNIQUE: Transvaginal ultrasound was performed for complete evaluation of the gestation as well as the maternal uterus, adnexal regions, and pelvic cul-de-sac. COMPARISON:  Ultrasound dated 08/29/2017 FINDINGS: Intrauterine gestational sac: Single intrauterine gestational sac. Yolk sac:  Seen Embryo:  Not present at this time. Cardiac Activity: N/A MSD: 7 mm   5 w   2 d Subchorionic hemorrhage:  None visualized. Maternal uterus/adnexae: The maternal ovaries appear unremarkable. IMPRESSION: Single intrauterine gestational sac with an estimated gestational age of [redacted] weeks, 2 days based on today's mean sac diameter. No fetal pole identified at this time. Continued follow-up with ultrasound in 7-11 days, or earlier if clinically indicated, recommended. Electronically Signed   By: Elgie Collard M.D.   On: 09/01/2017 22:05   US Ob Transvaginal  Result Date: 08/29/2017 CLINICAL DATA:  Vaginal bleeding. EXAM: OBSTETRIC <14 WK [place] AND TRANSVAGINAL OB US TECHNIQUE:  Both transabdominal and transvaginal ultrasound examinations were performed for  complete evaluation of the gestation as well as the maternal uterus, adnexal regions, and pelvic cul-de-sac. Transvaginal technique was performed to assess early pregnancy. COMPARISON:  None. FINDINGS: Intrauterine gestational sac: Single Yolk sac:  Not Visualized. Embryo:  Not Visualized. Cardiac Activity: Not Visualized. MSD: 5 mm   5 w   2 d Maternal uterus/adnexae: Subchorionic hemorrhage: None visualized. Right ovary: Normal Left ovary: Small heterogeneous structure within the left ovary measures 1.1 x 0.6 x 0.6 cm. Other :None Free fluid:  None IMPRESSION: 1. Probable early intrauterine gestational sac, but no yolk sac, fetal pole, or cardiac activity yet visualized. Recommend follow-up quantitative B-HCG levels and follow-up US in 14 days to assess viability. This recommendation follows SRU consensus guidelines: Diagnostic Criteria for Nonviable Pregnancy Early in the First Trimester. Malva Limes Med 2013; 161:0960-45. 2. Small heterogeneous solid-appearing structure within the left ovary measuring 11 mm. Indeterminate. Attention on follow-up imaging advise. Electronically Signed   By: Signa Kell M.D.   On: 08/29/2017 17:11   09/22/2017 Diagnosis Products of Conception - PRODUCTS OF CONCEPTION (CHORIONIC VILLI PRESENT).  Assessment & Plan:  1 week post op check- pt having pain.   CBC today Miralax (pt has a generic) 4 capful tonight and if no stool 4 capsul in the am  Motrin prn Pt to keep her post op with Dr. Jolayne Panther as well  Eber Jones L. Harraway-Smith, M.D., [facility] Core

## 2017-09-28 LAB — CBC
HEMATOCRIT: 38.5 % (ref 34.0–46.6)
HEMOGLOBIN: 12.8 g/dL (ref 11.1–15.9)
MCH: 29.6 pg (ref 26.6–33.0)
MCHC: 33.2 g/dL (ref 31.5–35.7)
MCV: 89 fL (ref 79–97)
Platelets: 230 10*3/uL (ref 150–450)
RBC: 4.33 x10E6/uL (ref 3.77–5.28)
RDW: 14.6 % (ref 12.3–15.4)
WBC: 5.6 10*3/uL (ref 3.4–10.8)

## 2017-10-02 ENCOUNTER — Ambulatory Visit (INDEPENDENT_AMBULATORY_CARE_PROVIDER_SITE_OTHER): Payer: 59 | Admitting: Family Medicine

## 2017-10-02 ENCOUNTER — Encounter: Payer: Self-pay | Admitting: Family Medicine

## 2017-10-02 VITALS — BP 90/62 | HR 101 | Ht 62.0 in | Wt 101.0 lb

## 2017-10-02 DIAGNOSIS — F321 Major depressive disorder, single episode, moderate: Secondary | ICD-10-CM | POA: Diagnosis not present

## 2017-10-02 MED ORDER — ESCITALOPRAM OXALATE 10 MG PO TABS: 10.0000 mg | ORAL_TABLET | Freq: Every day | ORAL | 1 refills | Status: DC

## 2017-10-02 MED ORDER — HYDROXYZINE PAMOATE 25 MG PO CAPS: 25.0000 mg | ORAL_CAPSULE | Freq: Three times a day (TID) | ORAL | 1 refills | Status: DC | PRN

## 2017-10-02 NOTE — Patient Instructions (Signed)
It was very nice to meet you Start lexapro daily Use hydroxyzine up to three times per day for anxiety.  This medication may make you drowsy. Continue seeing your therapist I will see you back in 2 weeks.   Consider looking into http://www.heartstringssupport.org for pregnancy loss support groups.    Living With Depression Everyone experiences occasional disappointment, sadness, and loss in their lives. When you are feeling down, blue, or sad for at least 2 weeks in a row, it may mean that you have depression. Depression can affect your thoughts and feelings, relationships, daily activities, and physical health. It is caused by changes in the way your brain functions. If you receive a diagnosis of depression, your health care provider will tell you which type of depression you have and what treatment options are available to you. If you are living with depression, there are ways to help you recover from it and also ways to prevent it from coming back. How to cope with lifestyle changes Coping with stress Stress is your body's reaction to life changes and events, both good and bad. Stressful situations may include:  Getting married.  The death of a spouse.  Losing a job.  Retiring.  Having a baby.  Stress can last just a few hours or it can be ongoing. Stress can play a major role in depression, so it is important to learn both how to cope with stress and how to think about it differently. Talk with your health care provider or a counselor if you would like to learn more about stress reduction. He or she may suggest some stress reduction techniques, such as:  Music therapy. This can include creating music or listening to music. Choose music that you enjoy and that inspires you.  Mindfulness-based meditation. This kind of meditation can be done while sitting or walking. It involves being aware of your normal breaths, rather than trying to control your breathing.  Centering prayer.  This is a kind of meditation that involves focusing on a spiritual word or phrase. Choose a word, phrase, or sacred image that is meaningful to you and that brings you peace.  Deep breathing. To do this, expand your stomach and inhale slowly through your nose. Hold your breath for 3-5 seconds, then exhale slowly, allowing your stomach muscles to relax.  Muscle relaxation. This involves intentionally tensing muscles then relaxing them.  Choose a stress reduction technique that fits your lifestyle and personality. Stress reduction techniques take time and practice to develop. Set aside 5-15 minutes a day to do them. Therapists can offer training in these techniques. The training may be covered by some insurance plans. Other things you can do to manage stress include:  Keeping a stress diary. This can help you learn what triggers your stress and ways to control your response.  Understanding what your limits are and saying no to requests or events that lead to a schedule that is too full.  Thinking about how you respond to certain situations. You may not be able to control everything, but you can control how you react.  Adding humor to your life by watching funny films or TV shows.  Making time for activities that help you relax and not feeling guilty about spending your time this way.  Medicines Your health care provider may suggest certain medicines if he or she feels that they will help improve your condition. Avoid using alcohol and other substances that may prevent your medicines from working properly (may interact). It  is also important to:  Talk with your pharmacist or health care provider about all the medicines that you take, their possible side effects, and what medicines are safe to take together.  Make it your goal to take part in all treatment decisions (shared decision-making). This includes giving input on the side effects of medicines. It is best if shared decision-making with your  health care provider is part of your total treatment plan.  If your health care provider prescribes a medicine, you may not notice the full benefits of it for 4-8 weeks. Most people who are treated for depression need to be on medicine for at least 6-12 months after they feel better. If you are taking medicines as part of your treatment, do not stop taking medicines without first talking to your health care provider. You may need to have the medicine slowly decreased (tapered) over time to decrease the risk of harmful side effects. Relationships Your health care provider may suggest family therapy along with individual therapy and drug therapy. While there may not be family problems that are causing you to feel depressed, it is still important to make sure your family learns as much as they can about your mental health. Having your family's support can help make your treatment successful. How to recognize changes in your condition Everyone has a different response to treatment for depression. Recovery from major depression happens when you have not had signs of major depression for two months. This may mean that you will start to:  Have more interest in doing activities.  Feel less hopeless than you did 2 months ago.  Have more energy.  Overeat less often, or have better or improving appetite.  Have better concentration.  Your health care provider will work with you to decide the next steps in your recovery. It is also important to recognize when your condition is getting worse. Watch for these signs:  Having fatigue or low energy.  Eating too much or too little.  Sleeping too much or too little.  Feeling restless, agitated, or hopeless.  Having trouble concentrating or making decisions.  Having unexplained physical complaints.  Feeling irritable, angry, or aggressive.  Get help as soon as you or your family members notice these symptoms coming back. How to get support and help  from others How to talk with friends and family members about your condition Talking to friends and family members about your condition can provide you with one way to get support and guidance. Reach out to trusted friends or family members, explain your symptoms to them, and let them know that you are working with a health care provider to treat your depression. Financial resources Not all insurance plans cover mental health care, so it is important to check with your insurance carrier. If paying for co-pays or counseling services is a problem, search for a local or county mental health care center. They may be able to offer public mental health care services at low or no cost when you are not able to see a private health care provider. If you are taking medicine for depression, you may be able to get the generic form, which may be less expensive. Some makers of prescription medicines also offer help to patients who cannot afford the medicines they need. Follow these instructions at home:  Get the right amount and quality of sleep.  Cut down on using caffeine, tobacco, alcohol, and other potentially harmful substances.  Try to exercise, such as walking or lifting  small weights.  Take over-the-counter and prescription medicines only as told by your health care provider.  Eat a healthy diet that includes plenty of vegetables, fruits, whole grains, low-fat dairy products, and lean protein. Do not eat a lot of foods that are high in solid fats, added sugars, or salt.  Keep all follow-up visits as told by your health care provider. This is important. Contact a health care provider if:  You stop taking your antidepressant medicines, and you have any of these symptoms: ? Nausea. ? Headache. ? Feeling lightheaded. ? Chills and body aches. ? Not being able to sleep (insomnia).  You or your friends and family think your depression is getting worse. Get help right away if:  You have thoughts of  hurting yourself or others. If you ever feel like you may hurt yourself or others, or have thoughts about taking your own life, get help right away. You can go to your nearest emergency department or call:  Your local emergency services (911 in the [place]  A suicide crisis helpline, such as the National Suicide Prevention Lifeline at (438) 094-7887. This is open 24-hours a day.  Summary  If you are living with depression, there are ways to help you recover from it and also ways to prevent it from coming back.  Work with your health care team to create a management plan that includes counseling, stress management techniques, and healthy lifestyle habits. This information is not intended to replace advice given to you by your health care provider. Make sure you discuss any questions you have with your health care provider. Document Released: 03/21/2016 Document Revised: 03/21/2016 Document Reviewed: 03/21/2016 Elsevier Interactive Patient Education  [employer].

## 2017-10-02 NOTE — Progress Notes (Signed)
Jessica Rojas - 27 y.o. female MRN [id number]  Date of birth: 05/10/1990  Subjective Chief Complaint  Patient presents with  . Depression    HPI Jessica CardRenisha Lampert is a 27 y.o. female here today to establish with pcp with complaint of depression and anxiety.  States she has several stressors including [place], being involved in an investigation as a witness and recent pregnancy loss.  Reports significant difficulty sleeping at night, feels like she can't stop thinking to go to sleep.  Also with diminished energy levels and anhedonia.  She denies any changes to appetite, SI/HI.  She has never been on any medication for depression/anxiety however she has been seeing a therapist which she finds helpful.   ROS: ROS complete and negative except as noted per HPI  [facility] 10/02/2017  Decreased Interest 1  Down, Depressed, Hopeless 3  PHQ - 2 Score 4  Altered sleeping 0  Tired, decreased energy 2  Change in appetite 1  Feeling bad or failure about yourself  2  Trouble concentrating 2  Moving slowly or fidgety/restless 0  Suicidal thoughts 0  PHQ-9 Score 11   GAD 7 : Generalized Anxiety Score 10/02/2017  Nervous, Anxious, on Edge 2  Control/stop worrying 3  Worry too much - different things 3  Trouble relaxing 3  Restless 2  Easily annoyed or irritable 2  Afraid - awful might happen 2  Total GAD 7 Score 17       Allergies  Allergen Reactions  . Tramadol Itching    Past Medical History:  Diagnosis Date  . Broken tooth    lower bottom back left  . HSV infection   . Von Willebrand disease (HCC)     Past Surgical History:  Procedure Laterality Date  . DILATION AND EVACUATION N/A 09/22/2017   Procedure: DILATATION AND EVACUATION;  Surgeon: Catalina Antiguaonstant, Peggy, MD;  Location: [facility];  Service: Gynecology;  Laterality: N/A;  . INDUCED ABORTION    . TONSILLECTOMY      Social History   Socioeconomic History  . Marital status:  Single    Spouse name: Not on file  . Number of children: Not on file  . Years of education: Not on file  . Highest education level: Not on file  Occupational History  . Not on file  Social Needs  . Financial resource strain: Not on file  . Food insecurity:    Worry: Not on file    Inability: Not on file  . Transportation needs:    Medical: Not on file    Non-medical: Not on file  Tobacco Use  . Smoking status: Never Smoker  . Smokeless tobacco: Never Used  Substance and Sexual Activity  . Alcohol use: No  . Drug use: No  . Sexual activity: Yes    Birth control/protection: None    Comment: approx [redacted] wks gestation  Lifestyle  . Physical activity:    Days per week: Not on file    Minutes per session: Not on file  . Stress: Not on file  Relationships  . Social connections:    Talks on phone: Not on file    Gets together: Not on file    Attends religious service: Not on file    Active member of club or organization: Not on file    Attends meetings of clubs or organizations: Not on file    Relationship status: Not on file  Other Topics Concern  .  Not on file  Social History Narrative  . Not on file    Family History  Problem Relation Age of Onset  . Hypertension Mother   . Cancer Neg Hx   . Stroke Neg Hx   . Diabetes Neg Hx     Health Maintenance  Topic Date Due  . HIV Screening  07/31/2004  . TETANUS/TDAP  07/31/2008  . PAP SMEAR  08/01/2010  . INFLUENZA VACCINE  11/30/2017    ----------------------------------------------------------------------------------------------------------------------------------------------------------------------------------------------------------------- Physical Exam BP 90/62   Pulse (!) 101   Ht 5\' 2"  (1.575 m)   Wt 101 lb (45.8 kg)   LMP 07/17/2017   Breastfeeding? No   BMI 18.47 kg/m   Physical Exam  Constitutional: She is oriented to person, place, and time. She appears well-nourished. No distress.  HENT:  Head:  Normocephalic and atraumatic.  Mouth/Throat: Oropharynx is clear and moist.  Eyes: No scleral icterus.  Neck: Neck supple. No thyromegaly present.  Cardiovascular: Normal rate, regular rhythm and normal heart sounds.  Pulmonary/Chest: Effort normal and breath sounds normal.  Neurological: She is alert and oriented to person, place, and time.  Psychiatric: Her behavior is normal. Judgment and thought content normal.  Depressed affect with intermittent tearfulness    ------------------------------------------------------------------------------------------------------------------------------------------------------------------------------------------------------------------- Assessment and Plan  Current moderate episode of major depressive disorder without prior episode (HCC) ~15 minutes spent administering and reviewing PHQ-9 and GAD results with her Multiple stressors contributing to depressive symptoms including recent pregnancy loss. Given information on heartstrings pregnancy loss group Continue seeing therapist Start lexapro 10mg  with vistaril 25mg  q8 hours prn.   I will see her back in 2 weeks.

## 2017-10-02 NOTE — Assessment & Plan Note (Addendum)
~  15 minutes spent administering and reviewing PHQ-9 and GAD results with her Multiple stressors contributing to depressive symptoms including recent pregnancy loss. Given information on heartstrings pregnancy loss group Continue seeing therapist Start lexapro 10mg  with vistaril 25mg  q8 hours prn.   I will see her back in 2 weeks.

## 2017-10-05 ENCOUNTER — Encounter: Payer: Self-pay | Admitting: Obstetrics and Gynecology

## 2017-10-05 ENCOUNTER — Ambulatory Visit (INDEPENDENT_AMBULATORY_CARE_PROVIDER_SITE_OTHER): Payer: 59 | Admitting: Obstetrics and Gynecology

## 2017-10-05 VITALS — BP 109/66 | HR 95 | Ht 62.0 in | Wt 152.0 lb

## 2017-10-05 DIAGNOSIS — Z9889 Other specified postprocedural states: Secondary | ICD-10-CM

## 2017-10-05 NOTE — Progress Notes (Signed)
27 yo G4P0040 s/p D&C on 5/24 for the treatment of a missed abortion. Patient reports feeling well with some light spotting. Patient is without any other complaints  Past Medical History:  Diagnosis Date  . Broken tooth    lower bottom back left  . HSV infection   . Von Willebrand disease (HCC)    Past Surgical History:  Procedure Laterality Date  . DILATION AND EVACUATION N/A 09/22/2017   Procedure: DILATATION AND EVACUATION;  Surgeon: Catalina Antiguaonstant, Arthor Gorter, MD;  Location: [facility];  Service: Gynecology;  Laterality: N/A;  . INDUCED ABORTION    . TONSILLECTOMY     Family History  Problem Relation Age of Onset  . Hypertension Mother   . Cancer Neg Hx   . Stroke Neg Hx   . Diabetes Neg Hx    Social History   Tobacco Use  . Smoking status: Never Smoker  . Smokeless tobacco: Never Used  Substance Use Topics  . Alcohol use: No  . Drug use: No   ROS See pertinent in HPI  Blood pressure 109/66, pulse 95, height 5\' 2"  (1.575 m), weight 152 lb (68.9 kg), last menstrual period 07/17/2017.  GENERAL: Well-developed, well-nourished female in no acute distress.  ABDOMEN: Soft, nontender, nondistended. No organomegaly. PELVIC: Not performed EXTREMITIES: No cyanosis, clubbing, or edema, 2+ distal pulses.  A/P 27 yo here for post op check - Patient is medically cleared to resume all activities of daily living - Patient's partner currently incarcerated. Patient plans abstinence for now.   - Encouraged the continued usage of prenatal vitamins - RTC for annual exam

## 2017-10-05 NOTE — Progress Notes (Signed)
Follow up D& C. Patient states she is still having some spotting. Armandina StammerJennifer Earleen Aoun RN

## 2017-10-16 ENCOUNTER — Ambulatory Visit (INDEPENDENT_AMBULATORY_CARE_PROVIDER_SITE_OTHER): Payer: 59 | Admitting: Family Medicine

## 2017-10-16 ENCOUNTER — Encounter: Payer: Self-pay | Admitting: Family Medicine

## 2017-10-16 VITALS — BP 98/64 | HR 110 | Temp 98.3°F | Ht 62.0 in | Wt 158.0 lb

## 2017-10-16 DIAGNOSIS — F4323 Adjustment disorder with mixed anxiety and depressed mood: Secondary | ICD-10-CM | POA: Insufficient documentation

## 2017-10-16 DIAGNOSIS — Z23 Encounter for immunization: Secondary | ICD-10-CM | POA: Diagnosis not present

## 2017-10-16 MED ORDER — ZOLPIDEM TARTRATE 5 MG PO TABS: 5.0000 mg | ORAL_TABLET | Freq: Every evening | ORAL | 1 refills | Status: AC | PRN

## 2017-10-16 NOTE — Patient Instructions (Signed)

## 2017-10-16 NOTE — Assessment & Plan Note (Signed)
Improved depressive symptoms however still with some anxiety and insomnia Will d/c hydroxyzine and lexapro due to lack of response and side effects Discussed options for insomnia, will give her a trial of ambien Discussed potential side effects Reviewed with her that we'll use this for only short period of time and then taper off.   I will see her back in about 2 weeks.

## 2017-10-16 NOTE — Progress Notes (Signed)
Stormy CardRenisha Alf - 27 y.o. female MRN [id number]  Date of birth: 08/21/1990  Subjective Chief Complaint  Patient presents with  . Follow-up    2 wk follow up/vision blurry, memory loss,still cant sleep/stop taking meds, more of the whole pill.     HPI Stormy CardRenisha Becht is a 27 y.o. female here today for follow up of adjustment disorder with depressed mood.  At her last visit we discussed several stressors contributing to her symptoms including recent move from [place] and pregnancy loss.  She was started on lexapro at that visit with hydroxyzine for anxiety and sleep.  She started lexapro at 5mg  however after increasing to 10mg  she began having side effects including blurred vision so she discontinued.  She did not see any improvement in insomnia with hydroxyzine.  Feels that depressive symptoms have improved some, with improvement of anhedonia and energy levels.  Still having significant insomnia however, mainly difficulty falling asleep.    ROS: ROS completed and negative except as noted per HPI  Allergies  Allergen Reactions  . Tramadol Itching    Past Medical History:  Diagnosis Date  . Broken tooth    lower bottom back left  . HSV infection   . Von Willebrand disease (HCC)     Past Surgical History:  Procedure Laterality Date  . DILATION AND EVACUATION N/A 09/22/2017   Procedure: DILATATION AND EVACUATION;  Surgeon: Catalina Antiguaonstant, Peggy, MD;  Location: [facility];  Service: Gynecology;  Laterality: N/A;  . INDUCED ABORTION    . TONSILLECTOMY      Social History   Socioeconomic History  . Marital status: Single    Spouse name: Not on file  . Number of children: Not on file  . Years of education: Not on file  . Highest education level: Not on file  Occupational History  . Not on file  Social Needs  . Financial resource strain: Not on file  . Food insecurity:    Worry: Not on file    Inability: Not on file  . Transportation needs:    Medical: Not on file    Non-medical: Not on  file  Tobacco Use  . Smoking status: Never Smoker  . Smokeless tobacco: Never Used  Substance and Sexual Activity  . Alcohol use: No  . Drug use: No  . Sexual activity: Yes    Birth control/protection: None    Comment: approx [redacted] wks gestation  Lifestyle  . Physical activity:    Days per week: Not on file    Minutes per session: Not on file  . Stress: Not on file  Relationships  . Social connections:    Talks on phone: Not on file    Gets together: Not on file    Attends religious service: Not on file    Active member of club or organization: Not on file    Attends meetings of clubs or organizations: Not on file    Relationship status: Not on file  Other Topics Concern  . Not on file  Social History Narrative  . Not on file    Family History  Problem Relation Age of Onset  . Hypertension Mother   . Cancer Neg Hx   . Stroke Neg Hx   . Diabetes Neg Hx     Health Maintenance  Topic Date Due  . HIV Screening  07/31/2004  . TETANUS/TDAP  07/31/2008  . PAP SMEAR  08/01/2010  . INFLUENZA VACCINE  11/30/2017    ----------------------------------------------------------------------------------------------------------------------------------------------------------------------------------------------------------------- Physical Exam BP 98/64  Pulse (!) 110   Temp 98.3 F (36.8 C) (Oral)   Ht 5\' 2"  (1.575 m)   Wt 158 lb (71.7 kg)   LMP 07/17/2017   SpO2 100%   BMI 28.90 kg/m   Physical Exam  Constitutional: She is oriented to person, place, and time. She appears well-nourished. No distress.  HENT:  Head: Normocephalic and atraumatic.  Mouth/Throat: Oropharynx is clear and moist.  Eyes: Pupils are equal, round, and reactive to light. EOM are normal. No scleral icterus.  Neck: Neck supple. No thyromegaly present.  Lymphadenopathy:    She has no cervical adenopathy.  Neurological: She is alert and oriented to person, place, and time.  Psychiatric: She has a  normal mood and affect. Her behavior is normal. Judgment and thought content normal.    ------------------------------------------------------------------------------------------------------------------------------------------------------------------------------------------------------------------- Assessment and Plan  Adjustment disorder with mixed anxiety and depressed mood Improved depressive symptoms however still with some anxiety and insomnia Will d/c hydroxyzine and lexapro due to lack of response and side effects Discussed options for insomnia, will give her a trial of ambien Discussed potential side effects Reviewed with her that we'll use this for only short period of time and then taper off.   I will see her back in about 2 weeks.

## 2017-10-30 ENCOUNTER — Ambulatory Visit (INDEPENDENT_AMBULATORY_CARE_PROVIDER_SITE_OTHER): Payer: 59 | Admitting: Family Medicine

## 2017-10-30 ENCOUNTER — Encounter: Payer: Self-pay | Admitting: Family Medicine

## 2017-10-30 VITALS — BP 112/64 | HR 87 | Temp 98.9°F | Ht 62.0 in | Wt 155.6 lb

## 2017-10-30 DIAGNOSIS — F4323 Adjustment disorder with mixed anxiety and depressed mood: Secondary | ICD-10-CM | POA: Diagnosis not present

## 2017-10-30 MED ORDER — SERTRALINE HCL 25 MG PO TABS: 25.0000 mg | ORAL_TABLET | Freq: Every day | ORAL | 3 refills | Status: AC

## 2017-10-30 NOTE — Progress Notes (Signed)
Jessica Rojas - 27 y.o. female MRN [id number]  Date of birth: 10/06/1990  Subjective Chief Complaint  Patient presents with  . Follow-up    insomnia/ depression, anxiety, vision has become less blurry since she d/c her medication.    HPI Jessica Rojas is a 27 y.o. female here today for follow up of adjustment with anxiety and depression.  Reports that she is sleeping much better with ambien.  Denies side effects including oversedation/morning fatigue.  Vision blurriness has improved since d/c of lexapro/hydroxyzine. She does feel that she would like to try another medication for depression and anxiety as she is worried about moving back to [place] and having to testify as a witness to a crime there.  She is coping fairly well with pregnancy loss at this point.    Dep[facility] screen [facility] 10/30/2017 10/02/2017  Decreased Interest 3 1  Down, Depressed, Hopeless 2 3  PHQ - 2 Score 5 4  Altered sleeping 1 0  Tired, decreased energy 1 2  Change in appetite 1 1  Feeling bad or failure about yourself  1 2  Trouble concentrating 1 2  Moving slowly or fidgety/restless 1 0  Suicidal thoughts 0 0  PHQ-9 Score 11 11   GAD 7 : Generalized Anxiety Score 10/30/2017 10/02/2017  Nervous, Anxious, on Edge 1 2  Control/stop worrying 1 3  Worry too much - different things 1 3  Trouble relaxing 1 3  Restless 1 2  Easily annoyed or irritable 1 2  Afraid - awful might happen 1 2  Total GAD 7 Score 7 17   ROS: ROS completed and negative except as noted per HPI  Allergies  Allergen Reactions  . Tramadol Itching    Past Medical History:  Diagnosis Date  . Broken tooth    lower bottom back left  . HSV infection   . Von Willebrand disease (HCC)     Past Surgical History:  Procedure Laterality Date  . DILATION AND EVACUATION N/A 09/22/2017   Procedure: DILATATION AND EVACUATION;  Surgeon: Catalina Antiguaonstant, Peggy, MD;  Location: [facility];  Service: Gynecology;  Laterality: N/A;  . INDUCED ABORTION    .  TONSILLECTOMY      Social History   Socioeconomic History  . Marital status: Single    Spouse name: Not on file  . Number of children: Not on file  . Years of education: Not on file  . Highest education level: Not on file  Occupational History  . Not on file  Social Needs  . Financial resource strain: Not on file  . Food insecurity:    Worry: Not on file    Inability: Not on file  . Transportation needs:    Medical: Not on file    Non-medical: Not on file  Tobacco Use  . Smoking status: Never Smoker  . Smokeless tobacco: Never Used  Substance and Sexual Activity  . Alcohol use: No  . Drug use: No  . Sexual activity: Yes    Birth control/protection: None    Comment: approx [redacted] wks gestation  Lifestyle  . Physical activity:    Days per week: Not on file    Minutes per session: Not on file  . Stress: Not on file  Relationships  . Social connections:    Talks on phone: Not on file    Gets together: Not on file    Attends religious service: Not on file    Active member of club or organization: Not on file  Attends meetings of clubs or organizations: Not on file    Relationship status: Not on file  Other Topics Concern  . Not on file  Social History Narrative  . Not on file    Family History  Problem Relation Age of Onset  . Hypertension Mother   . Cancer Neg Hx   . Stroke Neg Hx   . Diabetes Neg Hx     Health Maintenance  Topic Date Due  . HIV Screening  07/31/2004  . PAP SMEAR  08/01/2010  . INFLUENZA VACCINE  11/30/2017  . TETANUS/TDAP  10/17/2027    ----------------------------------------------------------------------------------------------------------------------------------------------------------------------------------------------------------------- Physical Exam BP 112/64 (BP Location: Left Arm, Patient Position: Sitting, Cuff Size: Normal)   Pulse 87   Temp 98.9 F (37.2 C) (Oral)   Ht 5\' 2"  (1.575 m)   Wt 155 lb 9.6 oz (70.6 kg)   LMP  07/17/2017   SpO2 98%   BMI 28.46 kg/m   Physical Exam  Constitutional: She is oriented to person, place, and time. She appears well-nourished. No distress.  HENT:  Head: Normocephalic and atraumatic.  Mouth/Throat: Oropharynx is clear and moist.  Eyes: No scleral icterus.  Cardiovascular: Normal rate, regular rhythm and normal heart sounds.  Pulmonary/Chest: Effort normal and breath sounds normal.  Neurological: She is alert and oriented to person, place, and time.  Psychiatric: She has a normal mood and affect. Her behavior is normal. Judgment and thought content normal.    ------------------------------------------------------------------------------------------------------------------------------------------------------------------------------------------------------------------- Assessment and Plan  Adjustment disorder with mixed anxiety and depressed mood Doing well with Jessica Rojas, will continue.  Discussed that we'll plan to try and wean in about 8-10 weeks.  She is unsure if she will be back in the area during that time, but will message me through mychart to let me know how she is doing.   Will add on sertraline for continued anxiety/depressive symptoms.

## 2017-10-30 NOTE — Patient Instructions (Signed)
Start sertraline 25mg  Continue ambien at bedtime F/u with me in about 8 weeks or message me and let me know how things are going I would recommend considering counseling once you're in [place] as well.

## 2017-10-30 NOTE — Assessment & Plan Note (Signed)
Doing well with Remus Lofflerambien, will continue.  Discussed that we'll plan to try and wean in about 8-10 weeks.  She is unsure if she will be back in the area during that time, but will message me through mychart to let me know how she is doing.   Will add on sertraline for continued anxiety/depressive symptoms.

## 2017-10-31 ENCOUNTER — Ambulatory Visit: Payer: 59 | Admitting: Nurse Practitioner

## 2018-03-16 ENCOUNTER — Emergency Department
Admission: EM | Admit: 2018-03-16 | Discharge: 2018-03-16 | Disposition: A | Discharge status: Home / Self Care | Payer: Self-pay | Attending: Emergency Medicine | Admitting: Emergency Medicine

## 2018-03-16 DIAGNOSIS — K0889 Other specified disorders of teeth and supporting structures: Secondary | ICD-10-CM | POA: Insufficient documentation

## 2018-03-16 DIAGNOSIS — H6121 Impacted cerumen, right ear: Secondary | ICD-10-CM | POA: Insufficient documentation

## 2018-03-16 MED ORDER — AMOXICILLIN 500 MG PO CAPS
500.00 mg | ORAL_CAPSULE | Freq: Two times a day (BID) | ORAL | 0 refills | Status: AC
Start: 2018-03-16 — End: 2018-03-21

## 2018-03-16 MED ORDER — IBUPROFEN 400 MG PO TABS
800.0000 mg | ORAL_TABLET | Freq: Once | ORAL | Status: AC
Start: 2018-03-16 — End: 2018-03-16
  Administered 2018-03-16: 22:00:00 800 mg via ORAL
  Filled 2018-03-16: qty 2

## 2018-03-16 MED ORDER — ACETAMINOPHEN 500 MG PO TABS
1000.0000 mg | ORAL_TABLET | Freq: Once | ORAL | Status: AC
Start: 2018-03-16 — End: 2018-03-16
  Administered 2018-03-16: 22:00:00 1000 mg via ORAL
  Filled 2018-03-16: qty 2

## 2018-03-16 MED ORDER — LIDOCAINE VISCOUS HCL 2 % MT SOLN
5.00 mL | Freq: Four times a day (QID) | OROMUCOSAL | 0 refills | Status: AC | PRN
Start: 2018-03-16 — End: 2018-03-23

## 2018-03-16 MED ORDER — AMOXICILLIN 500 MG PO CAPS
500.00 mg | ORAL_CAPSULE | Freq: Once | ORAL | Status: AC
Start: 2018-03-16 — End: 2018-03-16
  Administered 2018-03-16: 22:00:00 500 mg via ORAL
  Filled 2018-03-16: qty 1

## 2018-03-16 NOTE — ED Triage Notes (Signed)
Kimberly Chapman is a 27 y.o. female from home c/o right side facial pain/ dental pain first x 3 days, now has headache and right ear pain, and face is "hot".    Tylenol @ 1300, no relief . Pain 9.  Pt will need dentist referral.  + intermittent nausea.    BP 108/64   Pulse 85   Temp 98 F (36.7 C) (Oral)   Resp 14   Ht 5\' 2"  (1.575 m)   Wt 70.3 kg   SpO2 100%   BMI 28.35 kg/m

## 2018-03-16 NOTE — ED Provider Notes (Signed)
EMERGENCY DEPARTMENT HISTORY AND PHYSICAL EXAM     Physician/Midlevel provider first contact with patient: 03/16/18 2145         Date: 03/16/2018  Patient Name: Kimberly Chapman  Attending Physician: Cherlyn Roberts, MD  Advanced Practice Provider: Lynita Lombard    History of Presenting Illness       History Provided By: Patient    Chief Complaint:  Chief Complaint   Patient presents with   . Dental Pain     Onset: Few days  Timing: Constant  Location: Right-sided ear and teeth  Quality: Pain  Severity: Moderate  Exacerbating factors: None   Alleviating factors: None  Associated Symptoms: see ROS  Pertinent Negatives: see ROS    Additional History: Kimberly Chapman is a 27 y.o. female presenting to the ED with right-sided ear and dental pain.  Patient states been going on for a few days.  She has not seen a dentist recently.  She denies any fevers chills sore throat nausea vomiting or other symptoms    PCP: Pcp, None, MD  SPECIALISTS:     Borgmeyer, Selene   Home Medication Instructions [id number]    Printed on:03/17/18 0420   Medication Information 08 AM 10 AM 12 Noon 06 PM 08 PM 10 PM Bed time             amoxicillin (AMOXIL) 500 MG capsule  Take 1 capsule (500 mg total) by mouth 2 (two) times daily for 5 days            lidocaine viscous (XYLOCAINE) 2 % solution  Take 5 mLs by mouth every 6 (six) hours as needed for Pain                Past History     Past Medical History:  History reviewed. No pertinent past medical history.    Past Surgical History:  Past Surgical History:   Procedure Laterality Date   . DILATION AND CURETTAGE OF UTERUS         Family History:  History reviewed. No pertinent family history.    Social History:  Social History     Socioeconomic History   . Marital status: Single     Spouse name: None   . Number of children: None   . Years of education: None   . Highest education level: None   Occupational History   . None   Social Needs   . Financial resource strain: None   . Food  insecurity:     Worry: None     Inability: None   . Transportation needs:     Medical: None     Non-medical: None   Tobacco Use   . Smoking status: Never Smoker   . Smokeless tobacco: Never Used   Substance and Sexual Activity   . Alcohol use: Never     Frequency: Never   . Drug use: Never   . Sexual activity: None   Lifestyle   . Physical activity:     Days per week: None     Minutes per session: None   . Stress: None   Relationships   . Social connections:     Talks on phone: None     Gets together: None     Attends religious service: None     Active member of club or organization: None     Attends meetings of clubs or organizations: None     Relationship status: None   . Intimate  partner violence:     Fear of current or ex partner: None     Emotionally abused: None     Physically abused: None     Forced sexual activity: None   Other Topics Concern   . None   Social History Narrative   . None       Allergies:  Allergies   Allergen Reactions   . Tramadol Itching       Review of Systems     General: No fever, no sweats, no chills.   Head/Neck: No headache, no neck pain positive dental pain  ENT: No sore throat, no nasal congestion, no rhinorrhea positive right ear pain  Respiratory: No cough, no shortness of breath.  Cardiovascular: No chest pain.   Gastrointestinal: No nausea, no vomiting, no diarrhea.   Genito-Urinary: No dysuria, no frequency, no hematuria  Musculoskeletal: No myalgias, no joint pains.    Dermatological: No new rash, no color changes.   Psychological: No acute mood changes, no confusion.     Review of Systems    Physical Exam     Vitals:    03/16/18 2130   BP: 108/64   Pulse: 85   Resp: 14   Temp: 98 F (36.7 C)   TempSrc: Oral   SpO2: 100%   Weight: 70.3 kg   Height: 5\' 2"  (1.575 m)         Constitutional: Vital signs reviewed. Well appearing  Head: Normocephalic, atraumatic. No external trauma noted.  Eyes: Conjunctiva and sclera are normal. Pupils equal, round and reactive.   Ear, Nose,  Throat:  Normal external examination of the nose and ears. Oropharynx clear, moist mucous membranes. No tonsillar swelling or exudates, uvula midline, no pooling of secretions, TMs without erythema or drainage bilaterally.    Right TM obstructed by cerumen which I removed with a curette  Patient has poor dentition no obvious abscess no loose teeth  Neck: Supple. Trachea midline.  No cervical lymphadenopathy. No meningismus.   Respiratory: No respiratory distress. No wheezing, no rhonchi, and no rales.  Cardiovascular: Regular rate. Regular rhythm. S1, S2.   Abdomen: Normoactive Bowel sounds. Soft. Non-tender to palpation. No guarding or rebound.   Back: No midline tenderness, no CVA tenderness.   Extremities: Upper and lower extremities with no cyanosis or edema.   Skin: Warm and dry. No rash.  Neuro: alert and appropriate, normal speech, no facial droop, moving all extremities.     Physical Exam    Diagnostic Study Results     Labs -     Results     ** No results found for the last 24 hours. **          Radiologic Studies -   Radiology Results (24 Hour)     ** No results found for the last 24 hours. **      .    Medical Decision Making   I am the first provider for this patient.    I reviewed the vital signs, available nursing notes, past medical history, past surgical history, family history and social history.    Vital Signs-Reviewed the patient's vital signs.     Patient Vitals for the past 12 hrs:   BP Temp Pulse Resp   03/16/18 2130 108/64 98 F (36.7 C) 85 14       Pulse Oximetry Analysis - Normal 100% on RA       Procedures:   Procedures  Old Medical Records: Old medical records.     ED Course:        Provider Notes:    27 y.o. female presents with right-sided dental pain and ear pain.  Patient does have a cerumen impaction on the right which I removed with a curette.  No evidence of otitis.  Dental pain start antibiotics patient has poor dentition no evidence of abscess.  I do not suspect any  deep neck space infection.  Patient has no trismus.    Referral given   soft diet   patient stable and agrees with plan      D/w Cherlyn Roberts, MD    Discharge Prescriptions     Medication Sig Dispense Auth. Provider    amoxicillin (AMOXIL) 500 MG capsule Take 1 capsule (500 mg total) by mouth 2 (two) times daily for 5 days 10 capsule Lynita Lombard, PA    lidocaine viscous (XYLOCAINE) 2 % solution Take 5 mLs by mouth every 6 (six) hours as needed for Pain 120 mL Lynita Lombard, PA            Diagnosis     Clinical Impression:   1. Impacted cerumen of right ear    2. Pain, dental        Treatment Plan:   ED Disposition     ED Disposition Condition Date/Time Comment    Discharge  Fri Mar 16, 2018 10:28 PM Keia Cheek discharge to home/self care.    Condition at disposition: Stable            _______________________________    CHART OWNERSHIPNigel Sloop, PA-C, am the primary clinician of record.  _______________________________       Lynita Lombard, PA  03/17/18 1610       Cherlyn Roberts, MD  03/18/18 [phone]

## 2018-03-16 NOTE — Discharge Instructions (Signed)
Clinics: Dental    DENTAL RESOURCES      Theressa Stamps    [facility] - [phone]    [facility] - [phone]  [address] [place] [address]    [facility] [phone]  basic dental services, no restorative services available    [facility] - [phone]  requires screening by social service agency/income requirement      [facility] - [phone]  [place] [facility] [phone] [facility] [address]  annual fee $35.00 for adults and $15.00 for children under age of 23    [facility] - [phone]  [facility] [place] [facility] - [phone]  [facility] - [phone]  [facility] - [phone]      [facility] [phone]  Faythe Dingwall - [phone]      OTHER RESOURCES  [facility] [place]  [phone]    [facility]  [phone]            Cerumen Impaction    You were seen for ear wax (cerumen). Your ear was impacted with it.    This means the wax partly or fully blocked the ear canal.    This can happen on its own. One common cause is putting Q-tips in the ear canal. The Q-tips are supposed to "clean them out." This often pushes the wax deeper into the ear. This causes the wax to keep building up. Earwax is naturally produced by your body. It helps protect your ear from dirt and debris ("junk").    Ears are generally self-cleaning. Q-tips should NOT be used to clean the ear canal! You can get ear wax removal kits at the pharmacy or drug store. You don't need a prescription. In the Emergency Department, the wax may be removed using irrigation. This is when it is washed out. A special tool may also be used to manually remove the wax. The doctor has special training for using these devices. Do NOT try this at home.    The wax  was removed from your ear. This can be irritating to the ear canal. It may be sore for a few hours. It may even bleed a little. This is normal. It should go away quickly.    YOU SHOULD SEEK MEDICAL ATTENTION IMMEDIATELY, EITHER HERE OR AT THE NEAREST EMERGENCY DEPARTMENT, IF ANY OF THE FOLLOWING OCCURS:   Ear drainage is bloody or smells really bad.   A lot of ear pain.   Fever (temperature higher than 100.73F / 38C).   A change in hearing.               Toothache    You have been seen for a toothache.    A toothache happens when the nerve of the tooth gets irritated. Infection, trauma, decay or cavities may cause this irritation. There may be pain after a tooth is lost (from trauma or being pulled).    Symptoms may include:   Pain with chewing.   Sensitivity to hot and cold.   The gums may get beefy red or inflamed in color.    Treatment depends on the toothache's cause. Follow up with a dentist right away for cavities and chipped teeth. Also see a dentist right away for post-extraction (after pulling) pain. Non-steroidal anti-inflammatory medicines (ibuprofen (Advil or Motrin); naproxen (Aleve, Naprosyn), etc.) can usually treat  dental pain. Often, simple toothaches do not need narcotic pain medicines.    Emergency and Urgent Care Doctors are not Dentists. Urgent Care and Emergency Department treatment IS NO SUBSTITUTE for treatment by a licensed dentist. Follow up with a dentist right away and plan to see your dentist on a regular schedule.    YOU SHOULD SEEK MEDICAL ATTENTION IMMEDIATELY, EITHER HERE OR AT THE NEAREST EMERGENCY DEPARTMENT, IF ANY OF THE FOLLOWING OCCURS:   Swelling of the face, neck, and cheeks.   High fever (temperature higher than 100.55F / 38C), chills, vomiting, signs of dehydration.   You can't swallow your saliva (spit) or medicine.

## 2018-04-26 ENCOUNTER — Emergency Department
Admission: EM | Admit: 2018-04-26 | Discharge: 2018-04-27 | Disposition: A | Discharge status: Home / Self Care | Payer: Self-pay | Attending: Emergency Medicine | Admitting: Emergency Medicine

## 2018-04-26 DIAGNOSIS — N83202 Unspecified ovarian cyst, left side: Secondary | ICD-10-CM | POA: Insufficient documentation

## 2018-04-26 DIAGNOSIS — D680 Von Willebrand's disease: Secondary | ICD-10-CM | POA: Insufficient documentation

## 2018-04-26 DIAGNOSIS — O4690 Antepartum hemorrhage, unspecified, unspecified trimester: Secondary | ICD-10-CM | POA: Insufficient documentation

## 2018-04-26 HISTORY — DX: Von Willebrand disease, unspecified: D68.00

## 2018-04-26 NOTE — ED Provider Notes (Addendum)
EMERGENCY DEPARTMENT HISTORY AND PHYSICAL EXAM     Physician/Midlevel provider first contact with patient: 04/26/18 2302         Date: 04/26/2018  Patient Name: Kimberly Chapman    History of Presenting Illness     Chief Complaint   Patient presents with   . Flu like symptoms       History Provided [id number]  Chief Complaint: body aches  Onset: 2 day ago  Timing: gradual  Location: generalized  Quality: ache  Severity: moderate  Modifying Factors: none  Associated Symptoms: runny nose, cough    Additional History: Kimberly Chapman is a 27 y.o. female.otherwise healthy here with above. She also states she took a pregnancy test at home yesterday and it was positive. She has some vaginal bleeding yesterday but has resolved. No abdominal pain, no vomiting, no fever.  LMP 03/27/18    PCP: Pcp, None, MD      No current facility-administered medications for this encounter.      No current outpatient medications on file.       Past History     Past Medical History:  Past Medical History:   Diagnosis Date   . Von Willebrand disease        Past Surgical History:  Past Surgical History:   Procedure Laterality Date   . DILATION AND CURETTAGE OF UTERUS         Family History:  History reviewed. No pertinent family history.    Social History:  Social History     Tobacco Use   . Smoking status: Never Smoker   . Smokeless tobacco: Never Used   Substance Use Topics   . Alcohol use: Never     Frequency: Never   . Drug use: Never       Allergies:  Allergies   Allergen Reactions   . Tramadol Itching       Review of Systems     Review of Systems   Constitutional: Negative for chills and fever.   HENT: Negative for ear pain and sore throat.    Respiratory: Positive for cough. Negative for shortness of breath.    Cardiovascular: Negative for chest pain.   Gastrointestinal: Negative for diarrhea, nausea and vomiting.   Genitourinary: Negative for dysuria and hematuria.        Vaginal bleeding   Musculoskeletal: Positive for myalgias. Negative  for falls.   Skin: Negative for rash.   Neurological: Negative for dizziness.   Psychiatric/Behavioral: Negative for suicidal ideas.   All other systems reviewed and are negative.        Physical Exam   BP 103/55   Pulse 88   Temp 97.1 F (36.2 C) (Oral)   Resp 18   Ht 5\' 2"  (1.575 m)   Wt 70.3 kg   LMP 03/26/2018 (Exact Date)   SpO2 100%   BMI 28.35 kg/m     Physical Exam   Constitutional: She is oriented to person, place, and time and well-developed, well-nourished, and in no distress. No distress.   Well appearing   HENT:   Head: Normocephalic and atraumatic.   Mouth/Throat: Oropharynx is clear and moist. No oropharyngeal exudate.   tms nl   Eyes: Conjunctivae are normal. Right eye exhibits no discharge. Left eye exhibits no discharge. No scleral icterus.   Cardiovascular: Normal rate, regular rhythm and intact distal pulses.   Pulmonary/Chest: Effort normal and breath sounds normal. No respiratory distress. She has no wheezes. She has no rales.  Abdominal: Soft. Bowel sounds are normal. She exhibits no distension. There is no abdominal tenderness. There is no rebound and no guarding.   Soft and non tender in all quadrants   Musculoskeletal:         General: No edema.   Neurological: She is alert and oriented to person, place, and time. GCS score is 15.   Skin: Skin is warm and dry. She is not diaphoretic.   Psychiatric: Mood and affect normal.         Diagnostic Study Results     Labs -     Results     Procedure Component Value Units Date/Time    Urine BHCG POC [[id number]]  (Abnormal) Collected:  04/27/18 0010     Updated:  04/27/18 0052     Urine bHCG POC Positive    Type and Screen [[id number]] Collected:  04/27/18 0004    Specimen:  Blood Updated:  04/27/18 0051     ABO Rh B POS     AB Screen Gel NEG    Beta HCG, Quant, Serum [[id number]] Collected:  04/27/18 0004     Updated:  04/27/18 0033     hCG, Sharene Butters. 1,079.5    Narrative:       Replace urinary catheter prior to obtaining the urine culture  if  it has been in place for greater than or equal to 14  days:->N/A No Foley  Indications for U/A Reflex to Micro - Reflex to  Culture:->Suprapubic Pain/Tenderness or Dysuria    GFR [[id number]] Collected:  04/27/18 0004     Updated:  04/27/18 0028     EGFR >60.0    Narrative:       Replace urinary catheter prior to obtaining the urine culture  if it has been in place for greater than or equal to 14  days:->N/A No Foley  Indications for U/A Reflex to Micro - Reflex to  Culture:->Suprapubic Pain/Tenderness or Dysuria    Comprehensive metabolic panel [[id number]] Collected:  04/27/18 0004    Specimen:  Blood Updated:  04/27/18 0028     Glucose 92 mg/dL      BUN 38.7 mg/dL      Creatinine 0.8 mg/dL      Sodium 564 mEq/L      Potassium 4.3 mEq/L      Chloride 107 mEq/L      CO2 23 mEq/L      Calcium 9.6 mg/dL      Protein, Total 7.3 g/dL      Albumin 4.2 g/dL      AST (SGOT) 14 U/L      ALT 12 U/L      Alkaline Phosphatase 55 U/L      Bilirubin, Total 0.3 mg/dL      Globulin 3.1 g/dL      Albumin/Globulin Ratio 1.4     Anion Gap 7.0    Narrative:       Replace urinary catheter prior to obtaining the urine culture  if it has been in place for greater than or equal to 14  days:->N/A No Foley  Indications for U/A Reflex to Micro - Reflex to  Culture:->Suprapubic Pain/Tenderness or Dysuria    Hemolysis index [[id number]] Collected:  04/27/18 0004     Updated:  04/27/18 0028     Hemolysis Index 12    Narrative:       Replace urinary catheter prior to obtaining the urine culture  if it has been in place for greater than or equal  to 14  days:->N/A No Foley  Indications for U/A Reflex to Micro - Reflex to  Culture:->Suprapubic Pain/Tenderness or Dysuria    UA Reflex to Micro - Reflex to Culture [[id number]] Collected:  04/27/18 0004     Updated:  04/27/18 0014     Urine Type Urine, Clean Ca     Color, UA Yellow     Clarity, UA Hazy     Specific Gravity UA 1.024     Urine pH 7.0     Leukocyte Esterase, UA Negative     Nitrite, UA  Negative     Protein, UR Negative     Glucose, UA Negative     Ketones UA Negative     Urobilinogen, UA Negative mg/dL      Bilirubin, UA Negative     Blood, UA Negative    Narrative:       Replace urinary catheter prior to obtaining the urine culture  if it has been in place for greater than or equal to 14  days:->N/A No Foley  Indications for U/A Reflex to Micro - Reflex to  Culture:->Suprapubic Pain/Tenderness or Dysuria    CBC and differential [[id number]] Collected:  04/27/18 0004    Specimen:  Blood Updated:  04/27/18 0012     WBC 6.99 x10 3/uL      Hgb 13.2 g/dL      Hematocrit 08.6 %      Platelets 235 x10 3/uL      RBC 4.60 x10 6/uL      MCV 89.1 fL      MCH 28.7 pg      MCHC 32.2 g/dL      RDW 14 %      MPV 9.0 fL      Neutrophils 55.2 %      Lymphocytes Automated 34.9 %      Monocytes 7.9 %      Eosinophils Automated 1.3 %      Basophils Automated 0.4 %      Immature Granulocyte 0.3 %      Nucleated RBC 0.0 /100 WBC      Neutrophils Absolute 3.86 x10 3/uL      Abs Lymph Automated 2.44 x10 3/uL      Abs Mono Automated 0.55 x10 3/uL      Abs Eos Automated 0.09 x10 3/uL      Absolute Baso Automated 0.03 x10 3/uL      Absolute Immature Granulocyte 0.02 x10 3/uL      Absolute NRBC 0.00 x10 3/uL     Narrative:       Replace urinary catheter prior to obtaining the urine culture  if it has been in place for greater than or equal to 14  days:->N/A No Foley  Indications for U/A Reflex to Micro - Reflex to  Culture:->Suprapubic Pain/Tenderness or Dysuria    Rapid Influenza A/B Antigens [[id number]] Collected:  04/26/18 2105    Specimen:  Nasopharyngeal from Nasal Aspirate Updated:  04/26/18 2149    Narrative:       ORDER#: [id number]                                    ORDERED BY: Sharion Balloon, Romy Mcgue  SOURCE: Nasal Aspirate                               COLLECTED:  04/26/18 21:05  ANTIBIOTICS AT COLL.:                                RECEIVED :  04/26/18 21:21  Influenza Rapid Antigen A&B                FINAL       04/26/18  21:49  04/26/18   Negative for Influenza A and B             Reference Range: Negative            Radiologic Studies -   Radiology Results (24 Hour)     Procedure Component Value Units Date/Time    US OB Transvag only  (preg < 14 weeks) [[id number]] Collected:  04/27/18 0104    Order Status:  Completed Updated:  04/27/18 0111    Narrative:       US OB TRANSVAGINAL ONLY    HISTORY:  pregnant, vaginal bleeding    TECHNIQUE:  Transvaginal ultrasound pelvis.         COMPARISON:  None.    FINDINGS:    Uterus: 8.4 x 4.8 x 6 cm.  Myometrium: Unremarkable.  Endometrium: Intrauterine pregnancy is not identified.  Cervix: Closed measuring 3.6 cm.        Right ovary:  3 x 2.6 x 1.5 cm. Normal blood flow. No concerning masses.  Left ovary: 4.4 x 4.8 x 3.2 cm. Normal blood flow. There is a complex  cyst in the left ovary measuring 2.7 cm.    Free pelvic fluid: Moderate simple free fluid is present in the  cul-de-sac.  Other findings: None significant      Impression:           1. Intrauterine pregnancy is not identified. This may be secondary to  early IUP, ectopic pregnancy, or spontaneous abortion. Continued  follow-up is recommended.  2. Simple moderate free fluid in the pelvis.  3. Hemorrhagic cyst in the left ovary.    Jorene Guest, MD   04/27/2018 1:07 AM      .      Medical Decision Making   I am the first provider for this patient.    I reviewed the vital signs, available nursing notes, past medical history, past surgical history, family history and social history.    Vital Signs-Reviewed the patient's vital signs.     Patient Vitals for the past 12 hrs:   BP Temp Pulse Resp   04/27/18 0127 103/55 97.1 F (36.2 C) 88 18   04/27/18 0008 115/74 98.1 F (36.7 C) 81 16   04/26/18 2058 118/73 98.6 F (37 C) 85 18       Pulse Oximetry Analysis -100% on RA  ED Course: Informed of all results, instructed to return to ED in 48 hours for repeat HCG as ectopic not ruled out.  She continues to deny any abdominal pain, she remains  well appearing.   Provider Notes:     Procedures:      Diagnosis     Clinical Impression:   1. Vaginal bleeding in pregnancy        _______________________________      _______________________________         Elliot Cousin, MD  04/27/18 4098       Elliot Cousin, MD  04/27/18 0130

## 2018-04-26 NOTE — ED Triage Notes (Addendum)
Coughing, sneezing, bodyaches and intermittent chest wall pain x one week. New vaginal spotting. LMP 03/26/2018

## 2018-04-27 ENCOUNTER — Emergency Department: Payer: Self-pay

## 2018-04-27 DIAGNOSIS — O4690 Antepartum hemorrhage, unspecified, unspecified trimester: Secondary | ICD-10-CM

## 2018-04-27 LAB — CBC AND DIFFERENTIAL
Absolute NRBC: 0 10*3/uL (ref 0.00–0.00)
Basophils Absolute Automated: 0.03 10*3/uL (ref 0.00–0.08)
Basophils Automated: 0.4 %
Eosinophils Absolute Automated: 0.09 10*3/uL (ref 0.00–0.44)
Eosinophils Automated: 1.3 %
Hematocrit: 41 % (ref 34.7–43.7)
Hgb: 13.2 g/dL (ref 11.4–14.8)
Immature Granulocytes Absolute: 0.02 10*3/uL (ref 0.00–0.07)
Immature Granulocytes: 0.3 %
Lymphocytes Absolute Automated: 2.44 10*3/uL (ref 0.42–3.22)
Lymphocytes Automated: 34.9 %
MCH: 28.7 pg (ref 25.1–33.5)
MCHC: 32.2 g/dL (ref 31.5–35.8)
MCV: 89.1 fL (ref 78.0–96.0)
MPV: 9 fL (ref 8.9–12.5)
Monocytes Absolute Automated: 0.55 10*3/uL (ref 0.21–0.85)
Monocytes: 7.9 %
Neutrophils Absolute: 3.86 10*3/uL (ref 1.10–6.33)
Neutrophils: 55.2 %
Nucleated RBC: 0 /100 WBC (ref 0.0–0.0)
Platelets: 235 10*3/uL (ref 142–346)
RBC: 4.6 10*6/uL (ref 3.90–5.10)
RDW: 14 % (ref 11–15)
WBC: 6.99 10*3/uL (ref 3.10–9.50)

## 2018-04-27 LAB — COMPREHENSIVE METABOLIC PANEL
ALT: 12 U/L (ref 0–55)
AST (SGOT): 14 U/L (ref 5–34)
Albumin/Globulin Ratio: 1.4 (ref 0.9–2.2)
Albumin: 4.2 g/dL (ref 3.5–5.0)
Alkaline Phosphatase: 55 U/L (ref 37–106)
Anion Gap: 7 (ref 5.0–15.0)
BUN: 11 mg/dL (ref 7.0–19.0)
Bilirubin, Total: 0.3 mg/dL (ref 0.2–1.2)
CO2: 23 mEq/L (ref 22–29)
Calcium: 9.6 mg/dL (ref 8.5–10.5)
Chloride: 107 mEq/L (ref 100–111)
Creatinine: 0.8 mg/dL (ref 0.6–1.0)
Globulin: 3.1 g/dL (ref 2.0–3.6)
Glucose: 92 mg/dL (ref 70–100)
Potassium: 4.3 mEq/L (ref 3.5–5.1)
Protein, Total: 7.3 g/dL (ref 6.0–8.3)
Sodium: 137 mEq/L (ref 136–145)

## 2018-04-27 LAB — ECG 12-LEAD
Atrial Rate: 80 {beats}/min
P Axis: 65 degrees
P-R Interval: 144 ms
Q-T Interval: 370 ms
QRS Duration: 76 ms
QTC Calculation (Bezet): 426 ms
R Axis: 67 degrees
T Axis: 44 degrees
Ventricular Rate: 80 {beats}/min

## 2018-04-27 LAB — URINALYSIS REFLEX TO MICROSCOPIC EXAM - REFLEX TO CULTURE
Bilirubin, UA: NEGATIVE
Blood, UA: NEGATIVE
Glucose, UA: NEGATIVE
Ketones UA: NEGATIVE
Leukocyte Esterase, UA: NEGATIVE
Nitrite, UA: NEGATIVE
Protein, UR: NEGATIVE
Specific Gravity UA: 1.024 (ref 1.001–1.035)
Urine pH: 7 (ref 5.0–8.0)
Urobilinogen, UA: NEGATIVE mg/dL (ref 0.2–2.0)

## 2018-04-27 LAB — TYPE AND SCREEN
AB Screen Gel: NEGATIVE
ABO Rh: B POS

## 2018-04-27 LAB — GFR: EGFR: >60

## 2018-04-27 LAB — HCG QUANTITATIVE: hCG, Quant.: 1079.5

## 2018-04-27 LAB — URINE BHCG POC: Urine bHCG POC: POSITIVE — AB

## 2018-04-27 LAB — HEMOLYSIS INDEX: Hemolysis Index: 12 (ref 0–18)

## 2018-04-27 NOTE — Discharge Instructions (Signed)
Vaginal Bleeding in Early Pregnancy - Ectopic Not Excluded     You were seen because you are pregnant. However, we CANNOT be sure yet that the pregnancy is in your womb (uterus). Your doctor is concerned that you might have an ectopic (tubal) pregnancy.     You will need to have some more tests done to see where the pregnancy is developing. Often, you will have another blood test in a few days. This will check if your pregnancy hormones are rising as they should. You will need to have another ultrasound in a few days. This will check how your pregnancy is developing. It will also make sure it is growing in the right place.     The following information will tell you more about ectopic pregnancy. It will also tell you about vaginal bleeding and belly pain during pregnancy:        In an ectopic (tubal) pregnancy, the embryo (baby) attaches to one of the fallopian tubes. The fallopian tubes lead from your ovaries to your uterus. The embryo is supposed to grow in the uterus. Sometimes, however, an embryo starts to grow inside the fallopian tubes or the ovaries instead. As it grows bigger, this can cause the fallopian tube to burst. This type of pregnancy CANNOT survive. It must be removed from the fallopian tube. Many serious complications can happen if the pregnancy stays in your body. The pregnant mother might die. Your symptoms are the same as those of an ectopic (tubal) pregnancy. These symptoms include pain in the belly and bleeding from the vagina.        The uterus is where a pregnancy should grow. Doctors will check to see if the pregnancy is in the uterus whenever a pregnant woman has certain symptoms. The most important of these symptoms is pain in the lower belly, with or without bleeding from the vagina. Sometimes this is easy to check. An ultrasound often shows where the pregnancy is. This can be done as early as 6 weeks into the pregnancy. If an ultrasound DOES NOT show a pregnancy in the uterus, there are  2 possibilities:     First: The pregnancy is less than 5 weeks old and too small to be seen by the ultrasound. This is the most common cause.  Second: The pregnancy is NOT in the uterus. Instead, it is in the fallopian tubes or somewhere else.     When the embryo is not seen on the first ultrasound, most doctors have a special blood test done. This test is called a "quantitative hCG," or a "quant." This test shows how much of the pregnancy hormone hCG that the pregnant woman has in her blood stream. It also helps to show the baby's age. The tests and the ultrasound help the doctor decide which other tests need to be done. They also help the doctor decide if you should follow up with an OB/GYN doctor.     The exams and testing you had done today show that you are pregnant. However, your pregnancy may or may not be in your uterus. It is possible that you have an ectopic (tubal) pregnancy. You will need more tests to be sure. These tests will help show if your pregnancy is normal or not.     Your doctor has decided that you can go home. However, it is EXTREMELY IMPORTANT to follow up with the OB/GYN or another doctor.     YOU SHOULD SEEK MEDICAL ATTENTION IMMEDIATELY, EITHER HERE OR   AT THE NEAREST EMERGENCY DEPARTMENT, IF ANY OF THE FOLLOWING OCCURS:     You have pain in your abdomen (belly), pelvis (hips), or back that gets worse.  You have more bleeding from the vagina, to the point where you need more than one pad per hour. You pass large clots or fetal tissue from your vagina.  You feel dizzy or lightheaded.   You faint (pass out).  You have nausea (feel sick to your stomach). You vomit (throw up). You have a fever (temperature higher than 100.4F / 38C) or chills.     If you can't follow up with your doctor, or if at any time you feel you need to be rechecked or seen again, come back here or go to the nearest emergency department.

## 2018-04-27 NOTE — ED Notes (Signed)
[facility] VERIFICATION   U# [id number]   BRID BAND # [id number]   VERIFIED BY (Full Name) Daryel Gerald [facility]

## 2018-04-27 NOTE — ED Notes (Signed)
[facility] BRID-BAND DOCUMENTATION   U# [id number]   BRID BAND # [id number]   SPECIMEN DRAWN BY (Full Name) [facility]

## 2018-05-14 IMAGING — US US OB TRANSVAGINAL
1 series · 15 of 28 positions shown · non-contrast
Comparison: Ultrasound dated 08/29/2017

CLINICAL DATA: 27-year-old female with spotting. LMP: 07/17/2017
corresponding to an estimated gestational age of 6 weeks, 4 days.

EXAM:
TRANSVAGINAL OB ULTRASOUND
TECHNIQUE: Transvaginal ultrasound was performed for complete evaluation of the
gestation as well as the maternal uterus, adnexal regions, and
pelvic cul-de-sac.

[Series 1: us ob transvaginal · 15 of 34 slices shown]
[im 1/34]
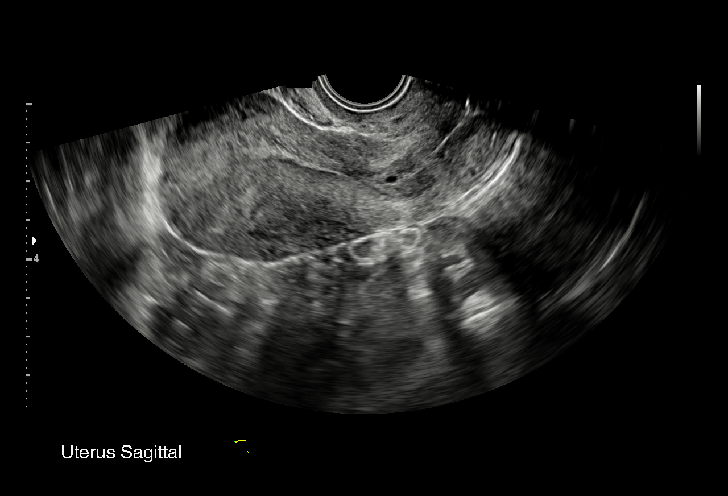
[im 3/34]
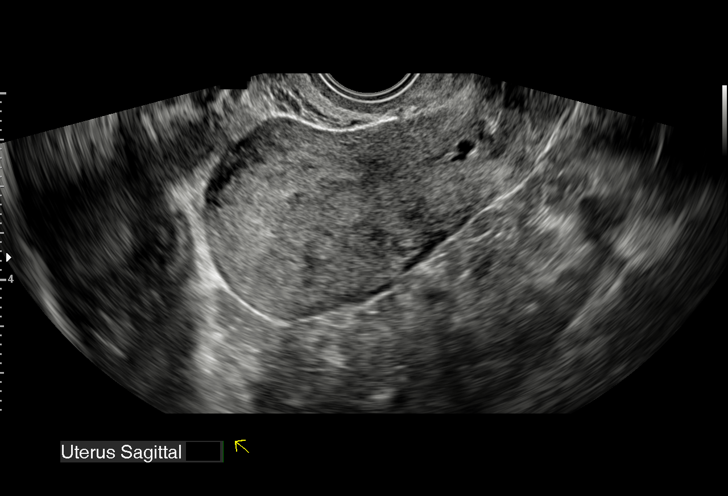
[im 5/34]
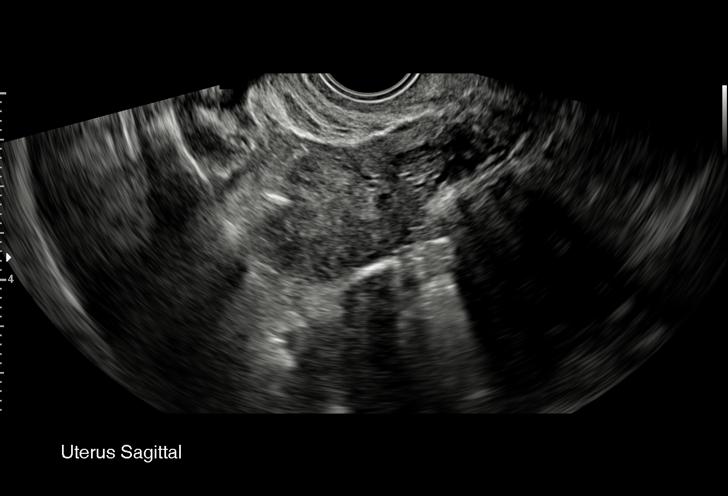
[im 8/34]
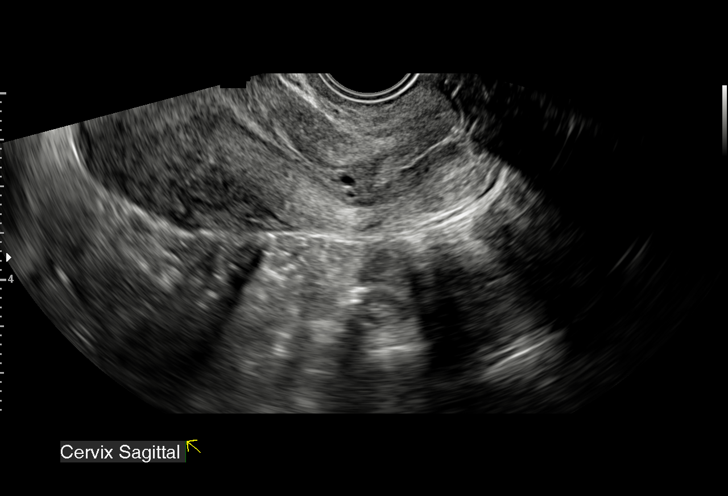
[im 10/34]
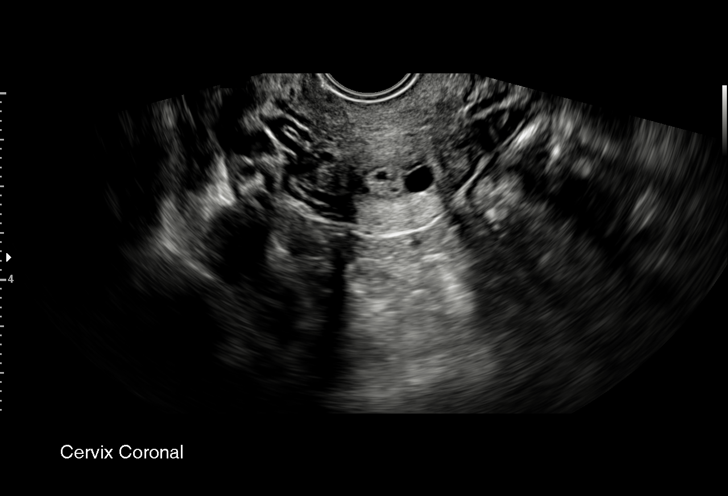
[im 13/34]
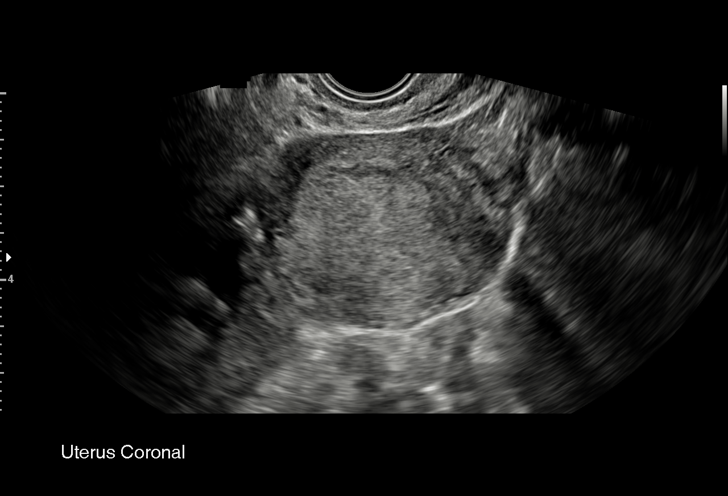
[im 15/34]
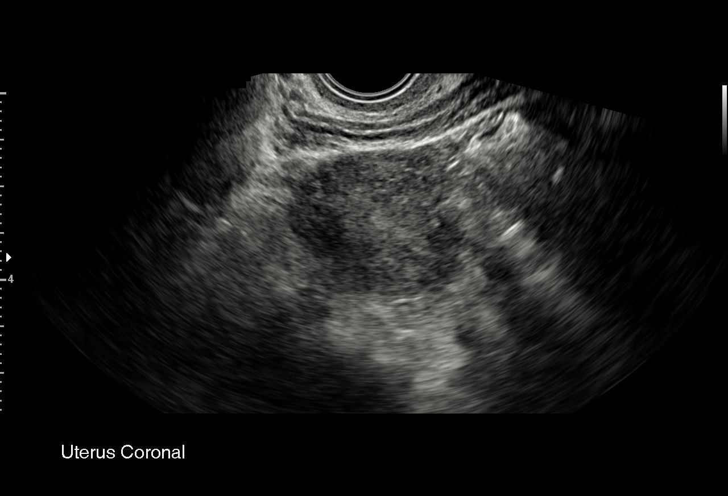
[im 18/34]
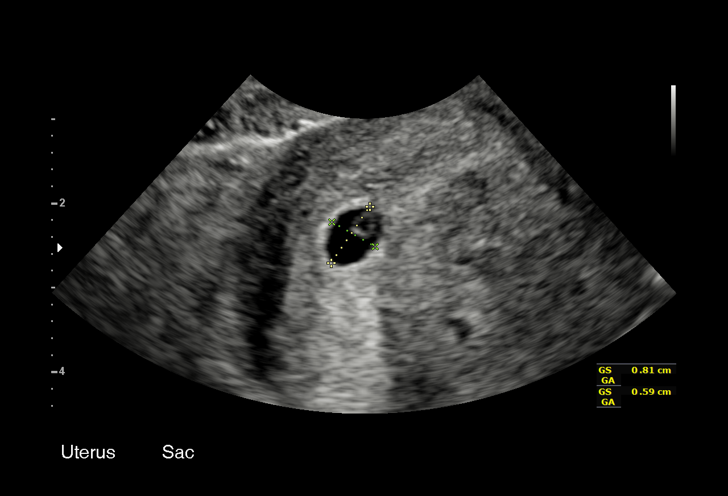
[im 19/34]
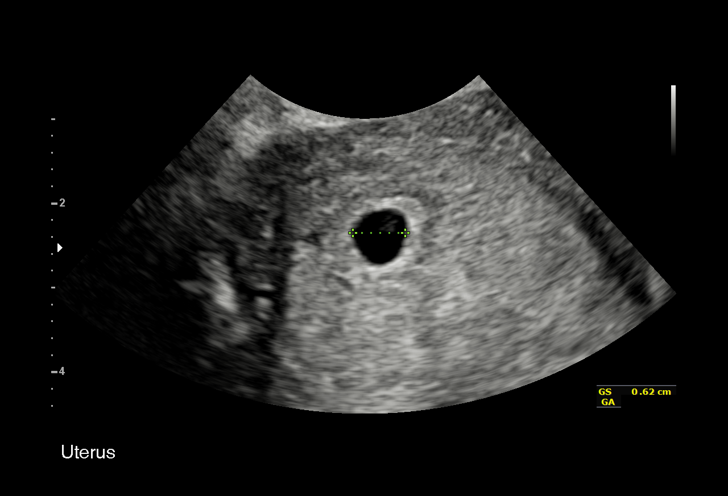
[im 21/34]
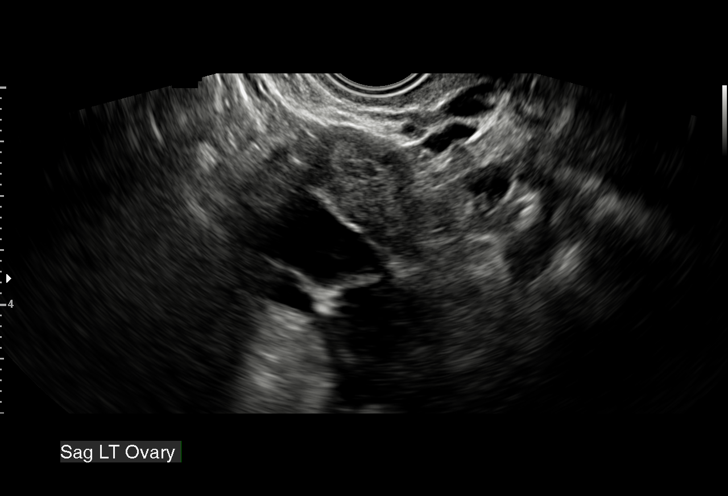
[im 24/34]
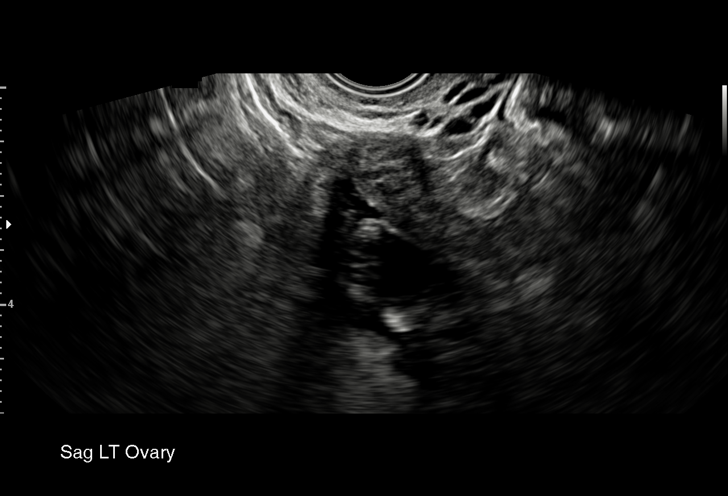
[im 26/34]
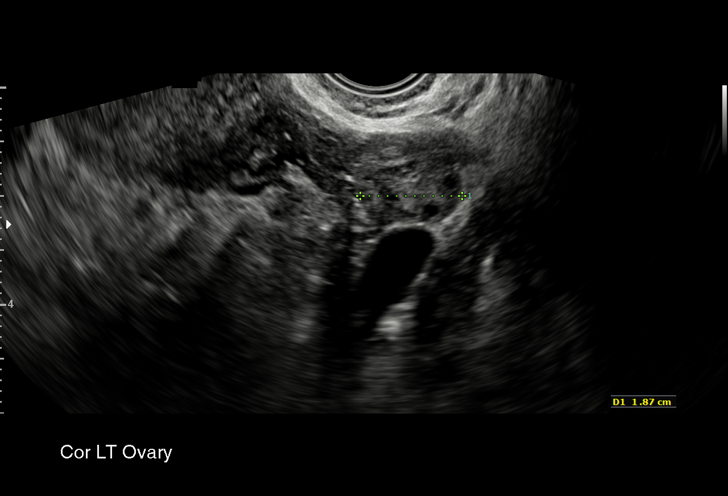
[im 29/34]
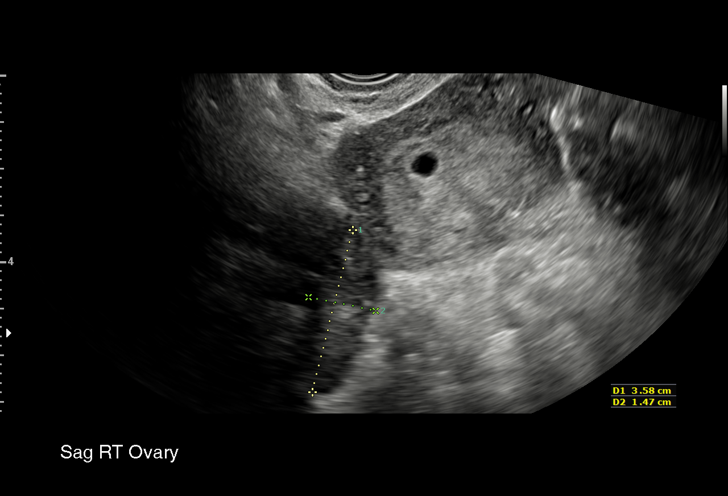
[im 31/34]
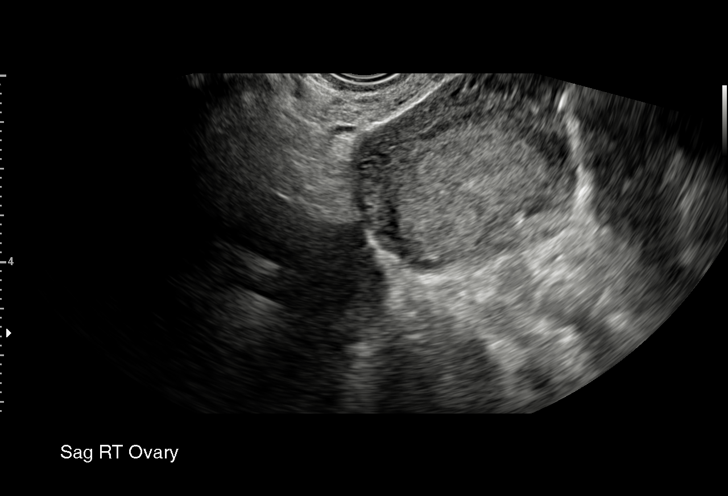
[im 34/34]
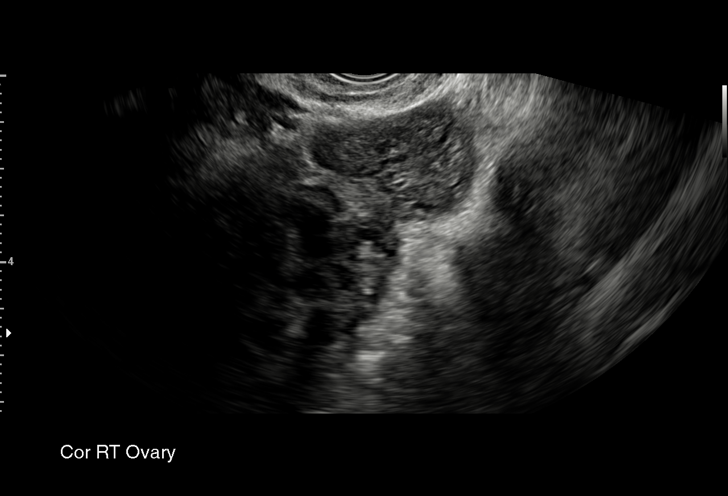

[15 of 28 positions shown; findings below may reference images not displayed]

FINDINGS: Intrauterine gestational sac: Single intrauterine gestational sac.

Yolk sac:  Seen

Embryo:  Not present at this time.

Cardiac Activity: N/A

MSD: 7 mm   5 w   2 d

Subchorionic hemorrhage:  None visualized.

Maternal uterus/adnexae: The maternal ovaries appear unremarkable.
IMPRESSION: Single intrauterine gestational sac with an estimated gestational
age of 5 weeks, 2 days based on today's mean sac diameter. No fetal
pole identified at this time. Continued follow-up with ultrasound in
7-11 days, or earlier if clinically indicated, recommended.

## 2018-05-18 ENCOUNTER — Emergency Department: Payer: No Typology Code available for payment source

## 2018-05-18 ENCOUNTER — Emergency Department
Admission: EM | Admit: 2018-05-18 | Discharge: 2018-05-18 | Disposition: A | Discharge status: Home / Self Care | Payer: No Typology Code available for payment source | Attending: Emergency Medicine | Admitting: Emergency Medicine

## 2018-05-18 DIAGNOSIS — N939 Abnormal uterine and vaginal bleeding, unspecified: Secondary | ICD-10-CM

## 2018-05-18 DIAGNOSIS — O200 Threatened abortion: Secondary | ICD-10-CM | POA: Insufficient documentation

## 2018-05-18 DIAGNOSIS — Z3A01 Less than 8 weeks gestation of pregnancy: Secondary | ICD-10-CM | POA: Insufficient documentation

## 2018-05-18 DIAGNOSIS — O468X1 Other antepartum hemorrhage, first trimester: Secondary | ICD-10-CM

## 2018-05-18 DIAGNOSIS — O43891 Other placental disorders, first trimester: Secondary | ICD-10-CM | POA: Insufficient documentation

## 2018-05-18 LAB — BASIC METABOLIC PANEL
Anion Gap: 8 (ref 5.0–15.0)
BUN: 8 mg/dL (ref 7.0–19.0)
CO2: 24 mEq/L (ref 22–29)
Calcium: 9.1 mg/dL (ref 8.5–10.5)
Chloride: 107 mEq/L (ref 100–111)
Creatinine: 0.7 mg/dL (ref 0.6–1.0)
Glucose: 79 mg/dL (ref 70–100)
Potassium: 4.1 mEq/L (ref 3.5–5.1)
Sodium: 139 mEq/L (ref 136–145)

## 2018-05-18 LAB — CBC AND DIFFERENTIAL
Absolute NRBC: 0 10*3/uL (ref 0.00–0.00)
Basophils Absolute Automated: 0.02 10*3/uL (ref 0.00–0.08)
Basophils Automated: 0.2 %
Eosinophils Absolute Automated: 0.04 10*3/uL (ref 0.00–0.44)
Eosinophils Automated: 0.5 %
Hematocrit: 35.8 % (ref 34.7–43.7)
Hgb: 11.7 g/dL (ref 11.4–14.8)
Immature Granulocytes Absolute: 0.02 10*3/uL (ref 0.00–0.07)
Immature Granulocytes: 0.2 %
Lymphocytes Absolute Automated: 1.73 10*3/uL (ref 0.42–3.22)
Lymphocytes Automated: 21.5 %
MCH: 29.3 pg (ref 25.1–33.5)
MCHC: 32.7 g/dL (ref 31.5–35.8)
MCV: 89.5 fL (ref 78.0–96.0)
MPV: 9.1 fL (ref 8.9–12.5)
Monocytes Absolute Automated: 0.46 10*3/uL (ref 0.21–0.85)
Monocytes: 5.7 %
Neutrophils Absolute: 5.78 10*3/uL (ref 1.10–6.33)
Neutrophils: 71.9 %
Nucleated RBC: 0 /100 WBC (ref 0.0–0.0)
Platelets: 215 10*3/uL (ref 142–346)
RBC: 4 10*6/uL (ref 3.90–5.10)
RDW: 13 % (ref 11–15)
WBC: 8.05 10*3/uL (ref 3.10–9.50)

## 2018-05-18 LAB — URINALYSIS REFLEX TO MICROSCOPIC EXAM - REFLEX TO CULTURE
Bilirubin, UA: NEGATIVE
Blood, UA: NEGATIVE
Glucose, UA: NEGATIVE
Ketones UA: NEGATIVE
Leukocyte Esterase, UA: NEGATIVE
Nitrite, UA: NEGATIVE
Protein, UR: NEGATIVE
Specific Gravity UA: 1.021 (ref 1.001–1.035)
Urine pH: 6 (ref 5.0–8.0)
Urobilinogen, UA: NEGATIVE mg/dL (ref 0.2–2.0)

## 2018-05-18 LAB — HEPATIC FUNCTION PANEL
ALT: 10 U/L (ref 0–55)
AST (SGOT): 12 U/L (ref 5–34)
Albumin/Globulin Ratio: 1.3 (ref 0.9–2.2)
Albumin: 3.6 g/dL (ref 3.5–5.0)
Alkaline Phosphatase: 48 U/L (ref 37–106)
Bilirubin Direct: 0.1 mg/dL (ref 0.0–0.5)
Bilirubin Indirect: 0 mg/dL — ABNORMAL LOW (ref 0.2–1.0)
Bilirubin, Total: 0.1 mg/dL — ABNORMAL LOW (ref 0.2–1.2)
Globulin: 2.7 g/dL (ref 2.0–3.6)
Protein, Total: 6.3 g/dL (ref 6.0–8.3)

## 2018-05-18 LAB — HEMOLYSIS INDEX: Hemolysis Index: 5 (ref 0–18)

## 2018-05-18 LAB — GFR: EGFR: >60

## 2018-05-18 LAB — HCG QUANTITATIVE: hCG, Quant.: 267058.7

## 2018-05-18 MED ORDER — SODIUM CHLORIDE 0.9 % IV BOLUS
1000.00 mL | Freq: Once | INTRAVENOUS | Status: AC
Start: 2018-05-18 — End: 2018-05-18
  Administered 2018-05-18: 10:00:00 1000 mL via INTRAVENOUS

## 2018-05-18 NOTE — Discharge Instructions (Signed)
Abnormal Vaginal Bleeding    You have been diagnosed with abnormal vaginal bleeding.    Women normally bleed when they have their periods (menstruation). Any other bleeding is called abnormal. This may include bleeding that is heavier than normal or that happens at a time when you dont normally bleed. You may have other symptoms. These could be pelvic/abdominal (belly) pain or cramps. You could also have back pain, nausea (feeling sick to your stomach) or problems urinating (peeing). You may also feel tired or have emotional disturbances.    This is a common problem. Most women have it at some time in their lives.    The cause is usually a mild change in hormone levels. The problem often goes away on its own.    Some other causes are: Changes in birth control use, too much exercise, stress, rapid weight changes, or being overweight. It is also caused by infections, thyroid problems or injury to the vagina/cervix or uterus.    The cause can also be more serious. However, this is rare. More serious causes include cancer of the cervix or uterus. Therefore, it is important to have a follow-up appointment. See your OB-GYN doctor or primary care doctor for the follow-up.    The health care professional who saw you feels it is OK for you to go home.    You may need to return here or go to the nearest Emergency Department if you develop more symptoms. An increase in symptoms might mean you are having complications from your bleeding. These may include very heavy bleeding and/or infection.    It is important to have a follow-up. See your gynecologist or family doctor in the next few days. We may contact your doctor about this abnormal vaginal bleeding evaluation. If not, be sure to tell him or her about it.   If you don't have the right follow-up care available, tell the medical staff before you go. They can help make arrangements for you.    YOU SHOULD SEEK MEDICAL ATTENTION IMMEDIATELY, EITHER HERE OR AT THE  NEAREST EMERGENCY DEPARTMENT, IF ANY OF THE FOLLOWING OCCURS:   You have worse pain in the abdomen (belly), pelvis or back.   Very large amounts of vaginal bleeding, soaking of pads/tampons (more than one pad per hour), passage of large clots.   Fever (temperature higher than 100.59F / 38C), chills, nausea, vomiting.   You're dizzy, lightheaded, or you pass out.               Miscarriage, Threatened    You have been diagnosed with a threatened miscarriage.    Your bleeding may be an early sign of miscarriage. You may have heard the term threatened abortion. This is the medical term for a miscarriage. It is a fairly common condition in the first trimester (12 weeks) of pregnancy. Half of all pregnant women have bleeding during pregnancy. Half of these women go on to have a normal pregnancy and the other half go on to have a miscarriage. You probably did nothing to cause to this. Nothing the doctor does will prevent a miscarriage once it starts.    It is OK to go home.    For the next few days you should:   Get plenty of rest. Avoid high-energy activities like heavy lifting or standing for a long time. If you must go back to work, ask your doctor about work restrictions. You don't have to stay in bed unless the doctor says to.  Stay well-hydrated at all times. Eat a balanced, healthy, nutritious diet. Also take your prenatal vitamins.   Avoid sexual intercourse and douching. Avoid putting anything into the vagina.   If you smoke, you need to quit. Smoking increases the risk of miscarriage and also causes birth defects.    Most medicines are not recommended during pregnancy. However, take any medicines your doctor prescribes. If you take any prescription medicines, be sure your doctor knows you are pregnant so any changes can be made. Acetaminophen (Tylenol) is considered safe for pregnant women and their babies.    It is VERY IMPORTANT to follow up with your obstetrician [facility] doctor) or gynecologist  in the next few days. Tell your OB doctor about this evaluation.    You may need to return here or go to the nearest Emergency Department if more symptoms that might signal miscarriage complications develop.    YOU SHOULD SEEK MEDICAL ATTENTION IMMEDIATELY, EITHER HERE OR AT THE NEAREST EMERGENCY DEPARTMENT, IF ANY OF THE FOLLOWING OCCURS:   More pain in the abdomen (belly), pelvis or back.   More vaginal bleeding, soaking pads/tampons through (more than one pad per hour). Passing large clots or fetal tissue.   You are dizzy, lightheaded, or you pass out.               Subchorionic Hematoma    You were diagnosed with a subchorionic hematoma.     A subchorionic hematoma is a pooling of blood inside your uterus (womb). It happens when you are pregnant. It is the most common cause of bleeding during the first trimester of pregnancy. A baby grows inside a sac called the chorion. A subchorionic hematoma is made of blood that has pooled together between the chorion and the endometrium. (The endometrium is the inside lining of your uterus).     Sometimes a subchorionic hematoma has no symptoms. Other symptoms are: vaginal bleeding, spotting, and premature labor pains. The seriousness of your subchorionic hematoma depends on several things. Some of these things are: how far along your pregnancy is, the size of your hematoma, and your symptoms.    An ultrasound is used to check for a subchorionic hematoma. Your doctor might find a subchorionic hematoma from an ultrasound you had as part of a general check-up. Your doctor might also give you an ultrasound because of bleeding or some of your other symptoms. If your hematoma is small and you have no symptoms, it is probably not dangerous. With time, the hematoma will most often go away on its own. A hematoma that is large and found during the late first or second trimester might cause problems. For example, you might have a miscarriage or go into premature labor. A  large hematoma is more likely to block the flow of blood from the placenta to the baby. This is dangerous because your baby needs this blood to live and grow.     The treatment for a subchorionic hematoma depends on how serious it is. Sometimes, you might not need any treatment. Other times, your pregnancy might need to be checked more often than normal. You might also need to be on strict bed rest, especially if you are bleeding. This is to protect yourself and the baby.    Follow up with your obstetrician (pregnancy doctor). Follow your doctors instructions about bed rest or pelvic rest very carefully.    Though we dont believe your condition is serious right now, it is important to be careful. Sometimes  a problem that seems mild can become serious later. This is why it is very important that you return here or go to the nearest Emergency Department if you are not improving or your symptoms are getting worse.    YOU SHOULD SEEK MEDICAL ATTENTION IMMEDIATELY, EITHER HERE OR AT THE NEAREST EMERGENCY DEPARTMENT, IF ANY OF THE FOLLOWING OCCUR:     You have vaginal bleeding, especially if the bleeding does not stop.    You think you are going into labor.    You have intense cramping and pain in your belly.    If you can't follow up with your doctor, or if at any time you feel you need to be rechecked or seen again, come back here or go to the nearest emergency department.

## 2018-05-18 NOTE — ED Provider Notes (Signed)
EMERGENCY DEPARTMENT HISTORY AND PHYSICAL EXAM     Physician/Midlevel provider first contact with patient: 05/18/18 0858         Date: 05/18/2018  Patient Name: Kimberly Chapman    History of Presenting Illness     Chief Complaint   Patient presents with    Abdominal Pain    Vaginal Bleeding       History Provided By: Patient    Chief Complaint: Vaginal bleeding and lower abdominal pain  Duration: 5 days  Timing: Intermittent  Location: Lower abdomen  Quality: "Pain"  Severity: Mild  Exacerbating factors: Standing or fever working long hours  Alleviating factors: Rest  Associated Symptoms: Small amount of vaginal bleeding  Pertinent Negatives: Patient is not soaking through more than 1 pad an hour.  She has no contractions or loss of fluid.  No lightheadedness or dizziness.  No chest pain or shortness of breath.  No fever.  She has no other complaints at this time.    Additional History: Kimberly Chapman is a 28 y.o. female presenting to the ED with vaginal bleeding and lower abdominal pain.  Patient is a G5 P0040 at approximately [redacted] weeks gestation by last ultrasound who presents with some vaginal bleeding for the past 5 days.  Patient initially had ultrasound which shows no evidence of IUP but later had ultrasound done at [facility] in [place] that showed IUP with date of approximately 5 weeks.  This was done about 1 week ago.  Patient's blood type is B+.  She has no chest pain or shortness of breath.  She does report consistent nausea with intermittent vomiting.  She has not taken anything for her vomiting so far but does have a prescription for Diclegis.  She has been taking her prenatal vitamins. OB/GYN appointment is in early February.      PCP: Pcp, None, MD  SPECIALISTS:    No current facility-administered medications for this encounter.      No current outpatient medications on file.       Past History     Past Medical History:  Past Medical History:   Diagnosis Date    Von Willebrand disease         Past Surgical History:  Past Surgical History:   Procedure Laterality Date    DILATION AND CURETTAGE OF UTERUS         Family History:  History reviewed. No pertinent family history.    Social History:  Social History     Tobacco Use    Smoking status: Never Smoker    Smokeless tobacco: Never Used   Substance Use Topics    Alcohol use: Never     Frequency: Never    Drug use: Never       Allergies:  Allergies   Allergen Reactions    Tramadol Itching       Review of Systems     Review of Systems   Constitutional: Negative for fever.   HENT: Negative for sore throat.    Respiratory: Negative for shortness of breath.    Cardiovascular: Negative for chest pain.   Gastrointestinal: Positive for abdominal pain.   Genitourinary: Positive for vaginal bleeding (small amout of spotting). Negative for difficulty urinating.   Musculoskeletal: Negative for myalgias.   Skin: Negative for rash.   Neurological: Negative for dizziness.   All other systems reviewed and are negative.         Physical Exam   BP 96/57    Pulse  86    Temp 98.5 F (36.9 C) (Oral)    Resp 16    Ht 5\' 2"  (1.575 m)    Wt 76.7 kg    LMP 03/26/2018 (Exact Date)    SpO2 100%    BMI 30.91 kg/m   Constitutional:  Well developed, well nourished. alert and awake  Head:  Atraumatic. Normocephalic.    Eyes:  PERRL. EOMI. No scleral icterus  ENT:  Mucous membranes are moist and intact. Oropharynx is clear.  External ears normal. Patent airway.  Neck:  Supple. Full ROM.    Cardiovascular:  Regular rate. Regular rhythm. No murmurs, rubs, or gallops.  Pulmonary/Chest:  No evidence of respiratory distress. Clear to auscultation bilaterally.  No wheezing, rales or rhonchi.   Abdominal:  Soft and non-distended.  Minimal amount of lower abdominal tenderness.  No rebound or rigidity.  Back:  Full ROM. Nontender.  Extremities:  No edema. No cyanosis. Full range of motion in all extremities.  Skin:  Skin is warm and dry.  No diaphoresis.  No rash  Neurological:   Alert, awake, and appropriate. Normal speech. Motor grossly normal. Cranial Nerves grossly intact by observation.   Psychiatric:  Good eye contact. Normal interaction, affect, and behavior.        Diagnostic Study Results     Labs -     Results     Procedure Component Value Units Date/Time    Beta HCG, Sharene Butters, Serum [[id number]] Collected:  05/18/18 0950     Updated:  05/18/18 1139     hCG, Quant. 829,562.7    Narrative:       Replace urinary catheter prior to obtaining the urine culture  if it has been in place for greater than or equal to 14  days:->N/A No Foley  Indications for U/A Reflex to Micro - Reflex to  Culture:->Suprapubic Pain/Tenderness or Dysuria    Basic Metabolic Panel [[id number]] Collected:  05/18/18 0950    Specimen:  Blood Updated:  05/18/18 1018     Glucose 79 mg/dL      BUN 8.0 mg/dL      Creatinine 0.7 mg/dL      Calcium 9.1 mg/dL      Sodium 696 mEq/L      Potassium 4.1 mEq/L      Chloride 107 mEq/L      CO2 24 mEq/L      Anion Gap 8.0    Narrative:       Replace urinary catheter prior to obtaining the urine culture  if it has been in place for greater than or equal to 14  days:->N/A No Foley  Indications for U/A Reflex to Micro - Reflex to  Culture:->Suprapubic Pain/Tenderness or Dysuria    Hepatic function panel (LFT) [[id number]]  (Abnormal) Collected:  05/18/18 0950    Specimen:  Blood Updated:  05/18/18 1018     Bilirubin, Total 0.1 mg/dL      Bilirubin, Direct 0.1 mg/dL      Bilirubin, Indirect 0.0 mg/dL      AST (SGOT) 12 U/L      ALT 10 U/L      Alkaline Phosphatase 48 U/L      Protein, Total 6.3 g/dL      Albumin 3.6 g/dL      Globulin 2.7 g/dL      Albumin/Globulin Ratio 1.3    Narrative:       Replace urinary catheter prior to obtaining the urine culture  if it has been in  place for greater than or equal to 14  days:->N/A No Foley  Indications for U/A Reflex to Micro - Reflex to  Culture:->Suprapubic Pain/Tenderness or Dysuria    Hemolysis index [[id number]] Collected:  05/18/18 0950      Updated:  05/18/18 1018     Hemolysis Index 5    Narrative:       Replace urinary catheter prior to obtaining the urine culture  if it has been in place for greater than or equal to 14  days:->N/A No Foley  Indications for U/A Reflex to Micro - Reflex to  Culture:->Suprapubic Pain/Tenderness or Dysuria    GFR [[id number]] Collected:  05/18/18 0950     Updated:  05/18/18 1018     EGFR >60.0    Narrative:       Replace urinary catheter prior to obtaining the urine culture  if it has been in place for greater than or equal to 14  days:->N/A No Foley  Indications for U/A Reflex to Micro - Reflex to  Culture:->Suprapubic Pain/Tenderness or Dysuria    UA Reflex to Micro - Reflex to Culture [[id number]] Collected:  05/18/18 0950     Updated:  05/18/18 1008     Urine Type Urine, Clean Ca     Color, UA Yellow     Clarity, UA Hazy     Specific Gravity UA 1.021     Urine pH 6.0     Leukocyte Esterase, UA Negative     Nitrite, UA Negative     Protein, UR Negative     Glucose, UA Negative     Ketones UA Negative     Urobilinogen, UA Negative mg/dL      Bilirubin, UA Negative     Blood, UA Negative    Narrative:       Replace urinary catheter prior to obtaining the urine culture  if it has been in place for greater than or equal to 14  days:->N/A No Foley  Indications for U/A Reflex to Micro - Reflex to  Culture:->Suprapubic Pain/Tenderness or Dysuria    CBC and differential [[id number]] Collected:  05/18/18 0951    Specimen:  Blood Updated:  05/18/18 1003     WBC 8.05 x10 3/uL      Hgb 11.7 g/dL      Hematocrit 62.9 %      Platelets 215 x10 3/uL      RBC 4.00 x10 6/uL      MCV 89.5 fL      MCH 29.3 pg      MCHC 32.7 g/dL      RDW 13 %      MPV 9.1 fL      Neutrophils 71.9 %      Lymphocytes Automated 21.5 %      Monocytes 5.7 %      Eosinophils Automated 0.5 %      Basophils Automated 0.2 %      Immature Granulocyte 0.2 %      Nucleated RBC 0.0 /100 WBC      Neutrophils Absolute 5.78 x10 3/uL      Abs Lymph Automated 1.73 x10 3/uL       Abs Mono Automated 0.46 x10 3/uL      Abs Eos Automated 0.04 x10 3/uL      Absolute Baso Automated 0.02 x10 3/uL      Absolute Immature Granulocyte 0.02 x10 3/uL      Absolute NRBC 0.00 x10 3/uL     Narrative:  Replace urinary catheter prior to obtaining the urine culture  if it has been in place for greater than or equal to 14  days:->N/A No Foley  Indications for U/A Reflex to Micro - Reflex to  Culture:->Suprapubic Pain/Tenderness or Dysuria  Rescheduled by [id number] at 05/18/2018 09:51 Reason: Printed by   mistake/Printing Issues.          Radiologic Studies -   Radiology Results (24 Hour)     Procedure Component Value Units Date/Time    US OB < 14 Weeks with Wilhelmina Mcardle [[id number]] Collected:  05/18/18 1037    Order Status:  Completed Updated:  05/18/18 1124    Narrative:       CLINICAL INDICATION: Vaginal bleeding    FINDINGS: The pelvis was studied with transvaginal sonography. There is  an intrauterine gestational sac. There is a fetal pole with a crown-rump  length of 1.2 cm which corresponds to a gestational age of 7.3 weeks. A  yolk sac is seen. There is a regular fetal heart rate and rhythm of 154  beats per minute.     A subchorionic hemorrhage identified inferior to the gestation measuring  15 x 7 x 8 mm The cervix is closed and measures 3.0 cm in length.    The right ovary measures 3.2 cm and the left 4.3 cm. 2.7 cm left ovarian  simple cyst noted compatible CL cyst. Small amount of free fluid is  present.      Impression:        Single live intrauterine gestation of 7.3 weeks. Small  subchorionic hemorrhage. Left ovarian cyst and small amount of free  fluid.    Heron Nay, MD   05/18/2018 11:20 AM      .    Medical Decision Making   I am the first provider for this patient.    I reviewed the vital signs, available nursing notes, past medical history, past surgical history, family history and social history.    Vital Signs-Reviewed the patient's vital signs.     No data found.    Pulse Oximetry  Analysis - Normal 100% on RA        Old Medical Records: Old medical records.         Provider Notes: Patient presents for lower abdominal pain and vaginal bleeding.  Patient is currently pregnant.  She is Rh+ and therefore no need for RhoGam.  Ultrasound obtained which shows IUP with good fetal heart tones.  Subchorionic hemorrhage identified.  This was discussed with the patient.  She is having no significant vaginal bleeding and only reports small amount of blood.  This is been going on for the past several days.  Patient is advised to closely follow-up with OB/GYN physician.  She is having less abdominal pain at discharge.  She has no evidence of ectopic pregnancy.  Advised that this could be an early miscarriage.  No evidence of urinary tract infection.  Laboratory studies are grossly unremarkable for significant acute clinical abnormalities.  Patient is nontoxic and well-appearing.  She is stable for outpatient management symptoms.  Patient agrees with disposition plan.        Diagnosis     Clinical Impression:   1. Vaginal bleeding    2. Threatened miscarriage in early pregnancy    3. Subchorionic hematoma in first trimester, single or unspecified fetus        Treatment Plan:   ED Disposition     ED Disposition Condition Date/Time Comment  Discharge  Fri May 18, 2018 11:32 AM Sihaam Spear discharge to home/self care.    Condition at disposition: Stable                _______________________________     Arther Abbott, DO  05/19/18 1414

## 2018-05-18 NOTE — ED Triage Notes (Signed)
Patient presents to the ED with c/o abdominal cramping and [redacted] weeks pregnant  with spotting starting on Monday.

## 2019-04-14 IMAGING — US US OB TRANSVAGINAL
1 series · 13 of 28 positions shown · non-contrast
Comparison: None.

CLINICAL DATA: Vaginal bleeding.

EXAM:
OBSTETRIC <14 WK US AND TRANSVAGINAL OB US
TECHNIQUE: Both transabdominal and transvaginal ultrasound examinations were
performed for complete evaluation of the gestation as well as the
maternal uterus, adnexal regions, and pelvic cul-de-sac.
Transvaginal technique was performed to assess early pregnancy.

[Series 1: us ob transvaginal · 0.12mm/px · 13 of 28 slices shown]
[im 2/28]
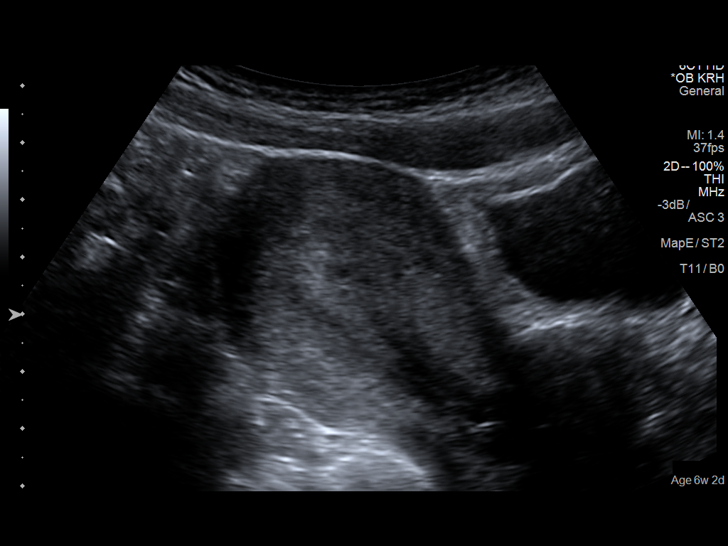
[im 4/28]
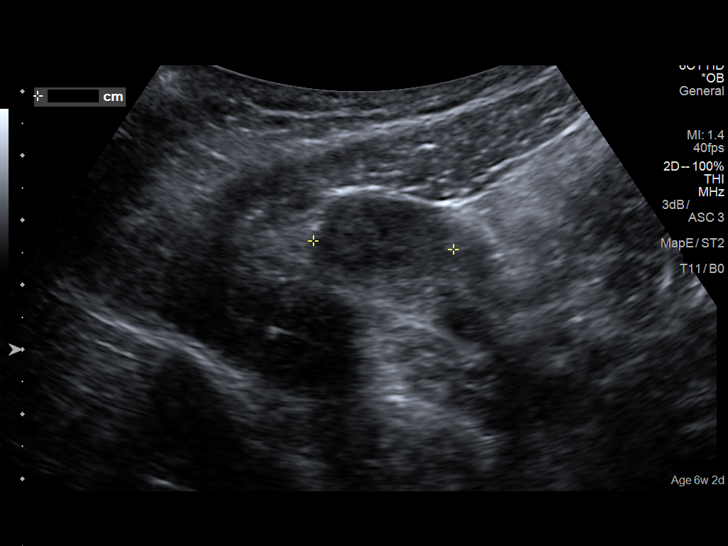
[im 6/28]
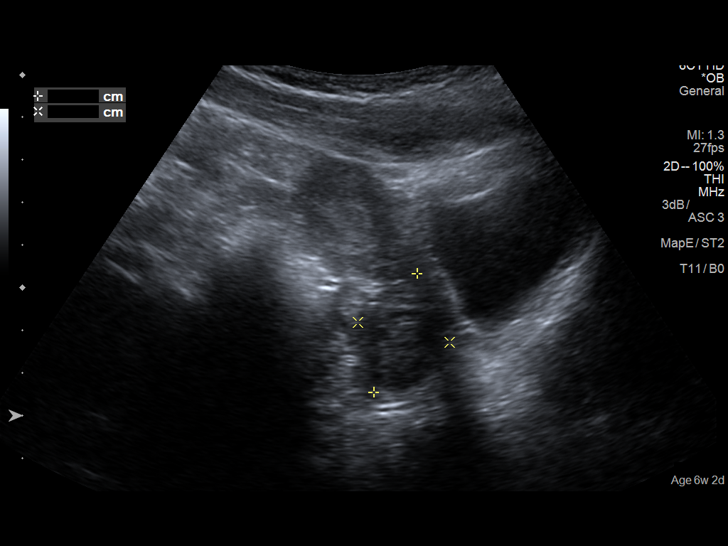
[im 8/28]
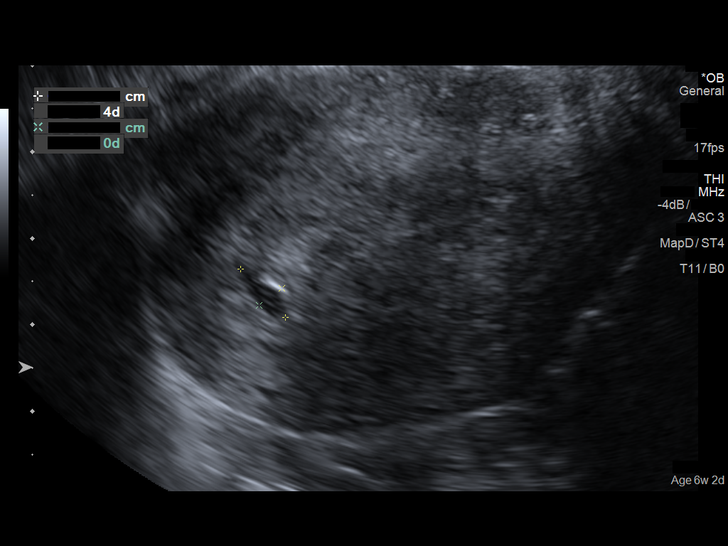
[im 10/28]
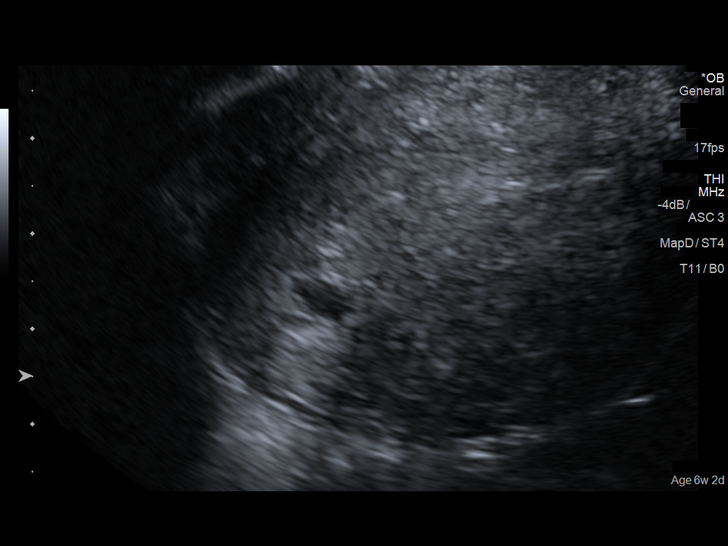
[im 12/28]
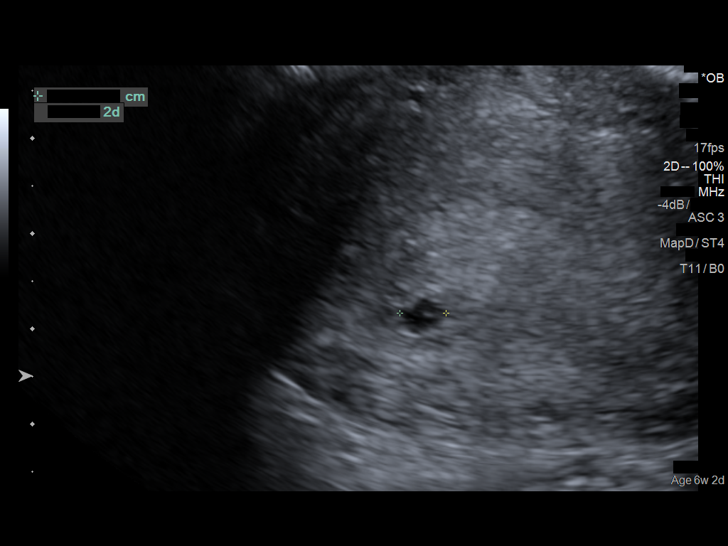
[im 15/28]
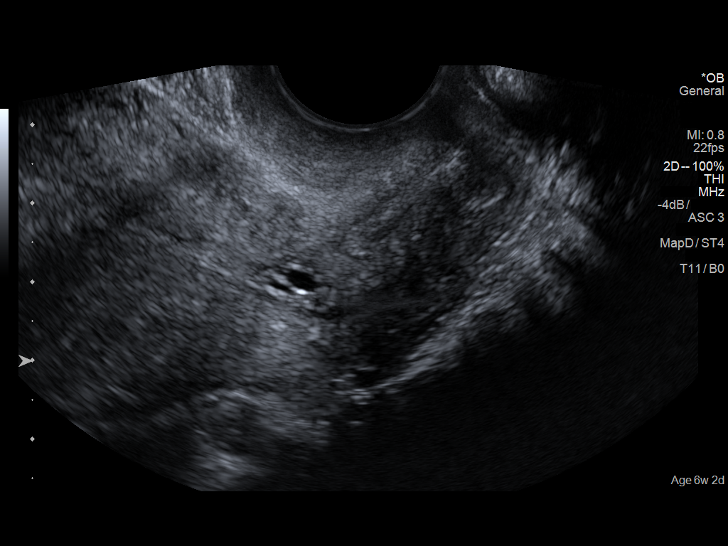
[im 17/28]
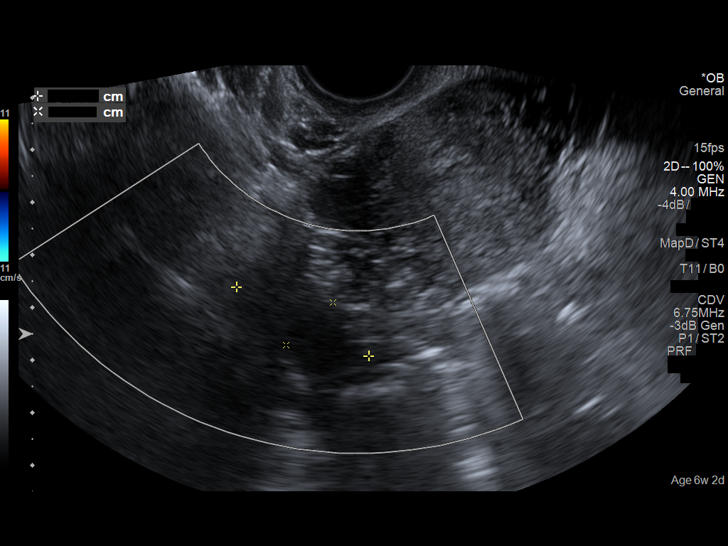
[im 19/28]
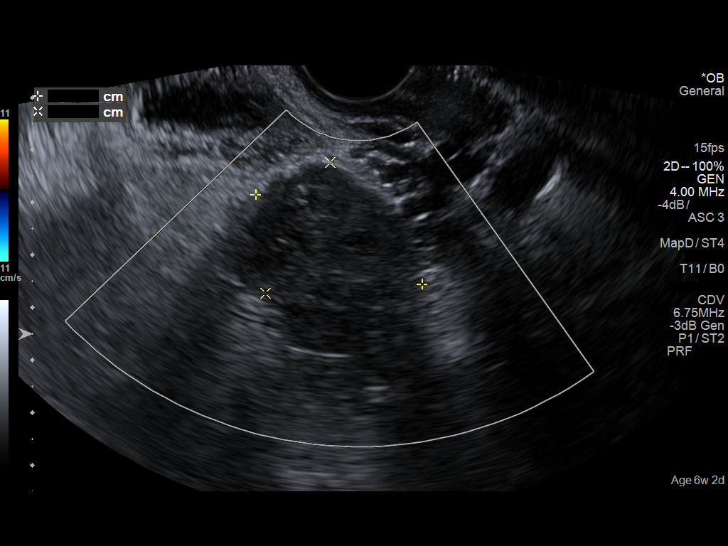
[im 21/28]
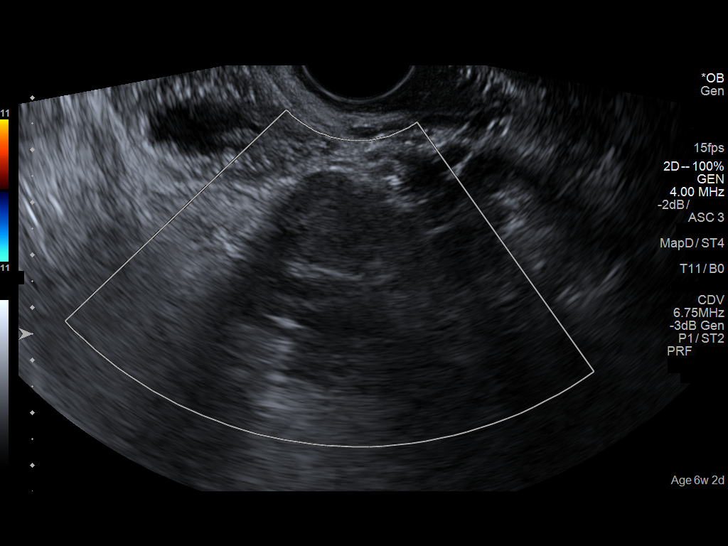
[im 23/28]
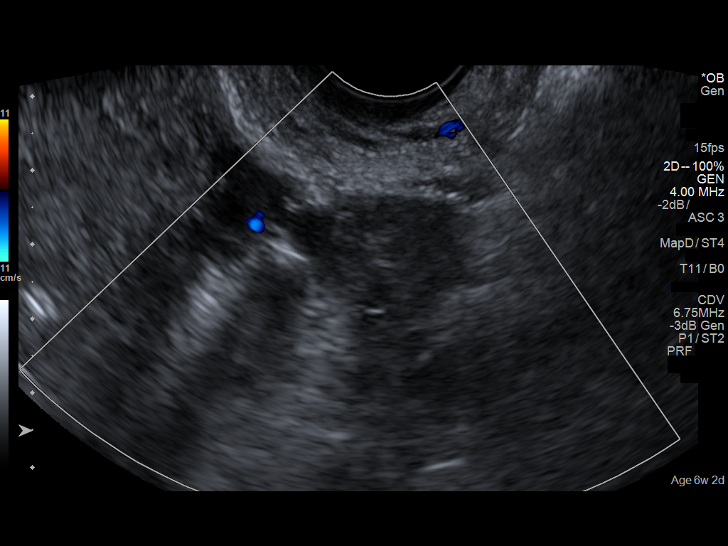
[im 25/28]
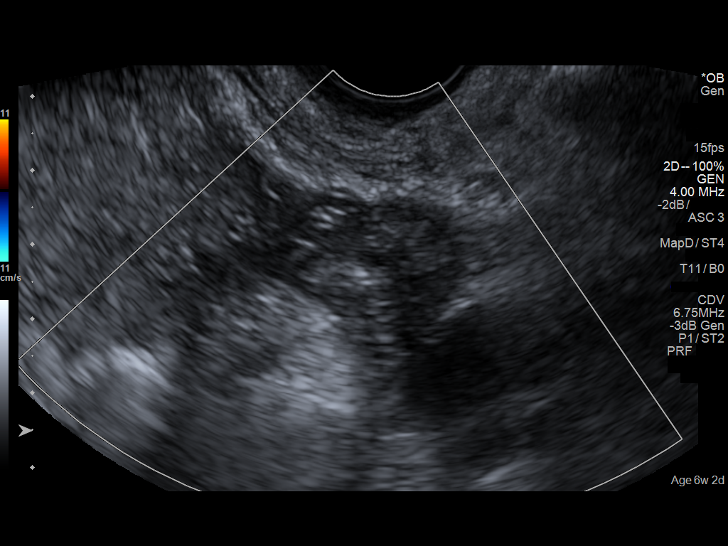
[im 27/28]
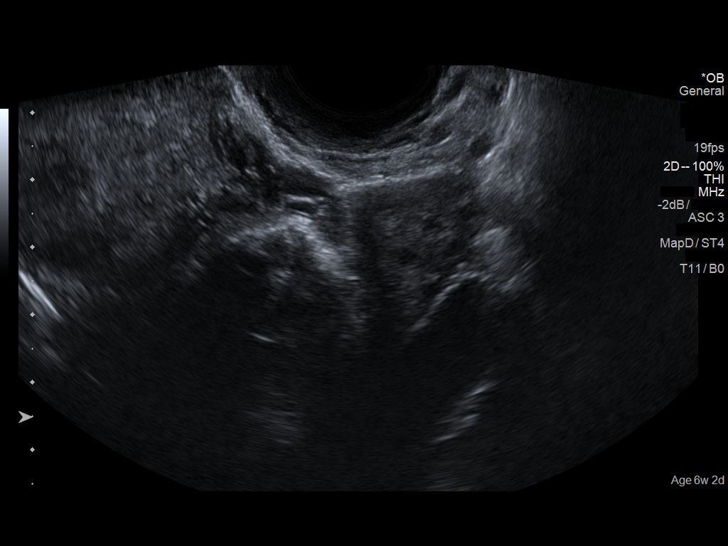

[13 of 28 positions shown; findings below may reference images not displayed]

FINDINGS: Intrauterine gestational sac: Single

Yolk sac:  Not Visualized.

Embryo:  Not Visualized.

Cardiac Activity: Not Visualized.

MSD: 5 mm   5 w   2 d

Maternal uterus/adnexae:

Subchorionic hemorrhage: None visualized.

Right ovary: Normal

Left ovary: Small heterogeneous structure within the left ovary
measures 1.1 x 0.6 x 0.6 cm.

Other :None

Free fluid:  None
IMPRESSION: 1. Probable early intrauterine gestational sac, but no yolk sac,
fetal pole, or cardiac activity yet visualized. Recommend follow-up
quantitative B-HCG levels and follow-up US in 14 days to assess
viability. This recommendation follows SRU consensus guidelines:
Diagnostic Criteria for Nonviable Pregnancy Early in the First
Trimester. N Engl J Med 5125; [DATE].
2. Small heterogeneous solid-appearing structure within the left
ovary measuring 11 mm. Indeterminate. Attention on follow-up imaging
advise.

## 2020-07-16 ENCOUNTER — Encounter (HOSPITAL_BASED_OUTPATIENT_CLINIC_OR_DEPARTMENT_OTHER): Payer: Self-pay

## 2024-05-10 ENCOUNTER — Encounter (INDEPENDENT_AMBULATORY_CARE_PROVIDER_SITE_OTHER): Payer: Self-pay

## 2024-05-10 ENCOUNTER — Ambulatory Visit (INDEPENDENT_AMBULATORY_CARE_PROVIDER_SITE_OTHER)

## 2024-05-10 VITALS — BP 113/74 | HR 78 | Temp 97.8°F | Resp 18 | Ht 62.0 in | Wt 195.0 lb

## 2024-05-10 DIAGNOSIS — K645 Perianal venous thrombosis: Secondary | ICD-10-CM

## 2024-05-10 MED ORDER — PHENYLEPHRINE IN HARD FAT 0.25 % RE SUPP: 1.0000 | Freq: Two times a day (BID) | RECTAL | 0 refills | Status: AC

## 2024-05-10 MED ORDER — LIDOCAINE 5 % EX OINT: TOPICAL_OINTMENT | Freq: Two times a day (BID) | CUTANEOUS | 0 refills | Status: AC

## 2024-05-10 NOTE — Patient Instructions (Signed)
 Drink clear fluids and try taking a stool softener (Metamucil) to help ease bowel movements.    Apply lidocaine  ointment twice daily and use Tronolane suppositories as prescribed for relief of discomfort.    Schedule follow up with the [facility] gastroenterology specialist for definitive care.    See your primary care provider for follow-up.    Return to clinic if needed.

## 2024-05-10 NOTE — Progress Notes (Signed)
 [facility]  OFFICE NOTE         Subjective   Historian: Patient      Chief Complaint   Patient presents with    Hemorrhoids     Pt c/o hemorrhoid for 1 week.      HPI    Lois is a 34 y.o. female who reports having a one week h/o painful, swollen hemorrhoids w/out any assoc bleeding or h/o constipation.  She's tried using Preparation-H products w/out any relief.      History:  Medications and Allergies reviewed.   Pertinent Past Medical, Surgical, Family and Social History were reviewed.        Objective     Vitals:    05/10/24 1809   BP: 113/74   BP Site: Right arm   Patient Position: Sitting   Cuff Size: Medium   Pulse: 78   Resp: 18   Temp: 97.8 F (36.6 C)   TempSrc: Tympanic   SpO2: 99%   Weight: 88.5 kg (195 lb)   Height: 1.575 m (5' 2)      Body mass index is 35.67 kg/m.          Physical Exam  Constitutional:       General: She is not in acute distress.     Appearance: She is not toxic-appearing.   HENT:      Head: Normocephalic.   Pulmonary:      Effort: Pulmonary effort is normal. No respiratory distress.   Genitourinary:     Comments: *firm, 1cm thrombosed external hemorrhoid with a (?) internal hemorrhoid also present; no bleeding or other anal lesions  Neurological:      General: No focal deficit present.      Mental Status: She is alert and oriented to person, place, and time.   Skin:     General: Skin is warm and dry.   Psychiatric:         Mood and Affect: Mood normal.   Vitals and nursing note reviewed. Exam conducted with a chaperone present [place] (MA)).     Urgent Care Course   There were no labs reviewed with this patient during the visit.    There were no x-rays reviewed with this patient during the visit.      Procedures   Procedures     Assessment / Plan     Differential Diagnoses including but not limited to: Hemorrhoid (external/internal), anal fissure      Alayzha was seen today for hemorrhoids.    Diagnoses and all orders for this visit:    Thrombosed  hemorrhoids  -     phenylephrine  (ANU-MED) 0.25 % suppository; Place 1 suppository rectally 2 (two) times daily  -     Referral to Gastroenterology ([place]); Future  -     lidocaine  (XYLOCAINE ) 5 % ointment; Apply topically 2 (two) times daily         The indications for early follow-up with PCP and return to UC were discussed. Patient/family received education on the working diagnosis, diagnostic uncertainties, and proposed treatment plan. Indications for emergency evaluation in the ED were reviewed. Written and verbal discharge instructions were provided and discussed and all questions from the patient/family were addressed, with no apparent barriers.       Plan:    Drink clear fluids and try taking a stool softener (Metamucil) to help ease bowel movements.    Apply lidocaine  ointment twice daily and use Tronolane suppositories as prescribed for relief  of discomfort.    Schedule follow up with the [facility] gastroenterology specialist for definitive care.    See your primary care provider for follow-up.    Return to clinic if needed.

## 2024-05-15 ENCOUNTER — Ambulatory Visit (INDEPENDENT_AMBULATORY_CARE_PROVIDER_SITE_OTHER): Admitting: Physician Assistant

## 2024-05-15 ENCOUNTER — Encounter (INDEPENDENT_AMBULATORY_CARE_PROVIDER_SITE_OTHER): Payer: Self-pay | Admitting: Physician Assistant

## 2024-05-15 DIAGNOSIS — K645 Perianal venous thrombosis: Secondary | ICD-10-CM

## 2024-05-15 MED ORDER — HYDROCORTISONE ACETATE 25 MG RE SUPP: 25.0000 mg | Freq: Two times a day (BID) | RECTAL | 1 refills | Status: AC

## 2024-05-15 MED ORDER — HYDROCORTISONE (PERIANAL) 2.5 % EX CREA: TOPICAL_CREAM | Freq: Two times a day (BID) | CUTANEOUS | 5 refills | Status: AC

## 2024-05-15 MED ORDER — LINACLOTIDE 145 MCG PO CAPS: 145.0000 ug | ORAL_CAPSULE | Freq: Every day | ORAL | 2 refills | Status: AC

## 2024-05-15 NOTE — Progress Notes (Signed)
 GASTROENTEROLOGY NEW PATIENT NOTE    [facility]  [facility])  [address]  [place], [place] [address]  Phone: [phone], Fax: [phone]          DATE OF VISIT: 05/15/2024  PRIMARY CARE PROVIDER: Pcp, None, MD      Verbal consent obtained to record this visit    ASSESSMENT/PLAN:     Kimberly Chapman is a 34 y.o. female presenting to the [facility] clinic for:     1. Thrombosed hemorrhoids      Assessment & Plan  Thrombosed hemorrhoid  Conservative management appropriate; symptoms expected to resolve spontaneously.  - Recommended sitz baths with Epsom salt.  - Prescribed hydrocortisone  cream for symptomatic relief.    Chronic constipation  Longstanding constipation likely contributing to hemorrhoidal symptoms. Non-adherence to daily osmotic laxative; no prior prescription therapy trial.  - Prescribed Linzess  at medium dose daily.  - Advised initial diarrhea may occur with Linzess ; notify if persists beyond two weeks for dose adjustment.  - Instructed to take Miralax daily if insurance does not cover Linzess .        Orders Placed This Encounter    linaCLOtide  (Linzess ) 145 MCG capsule    hydrocortisone  (Anusol -HC) 25 MG suppository    hydrocortisone  (ANUSOL -HC) 2.5 % rectal cream       The risks and benefits of my recommendations, as well as other treatment options were discussed with the patient today. Time was given to ask questions and all questions were answered. The patient expressed understanding and agrees with the plan. Patient strongly encouraged to set up follow up and reach out regarding any questions.       FOLLOW UP:  No follow-ups on file.         CHIEF COMPLAINT:   Rectal Pain and Hemorrhoids        HISTORY OF PRESENT ILLNESS:  Kimberly Chapman is a 34 y.o. female         History of Present Illness  Kimberly Chapman is a 34 year old female with chronic constipation who presents with anal pain and itching due to a thrombosed  hemorrhoid.    Severe anal pain and itching have been present for approximately two weeks. No specific precipitating event, such as a hard bowel movement, was identified prior to onset. Bowel movements were regular before the development of the hemorrhoid.    Constipation has been longstanding since childhood, with improvement over time. Mineral oil, Miralax, and fiber supplementation have been used intermittently, but Miralax was not taken daily until the onset of current hemorrhoid symptoms. No prior use of prescription medications for constipation. She was evaluated at urgent care and received a suppository (Zarbee's P) for her symptoms. Hydrocortisone  suppositories have not been used previously.    Dietary habits have been adjusted, including increased intake of sari, in an effort to improve symptoms. No recent hard bowel movements were reported prior to the onset of current symptoms.      Results           REVIEW OF SYSTEMS:  Negative except as stated in the HPI.      PAST MEDICAL HISTORY  Medical History[1]      PAST SURGICAL HISTORY:  Past Surgical History[2]      CURRENT MEDICATIONS:  Medications Taking[3]      ALLERGIES:  Allergies[4]      FAMILY HISTORY:  Family History[5]      SOCIAL HISTORY:  Social History[6]  PHYSICAL EXAM:  Wt Readings from Last 3 Encounters:   05/15/24 92.5 kg (203 lb 14.8 oz)   05/10/24 88.5 kg (195 lb)   05/18/18 76.7 kg (169 lb)     Temp Readings from Last 3 Encounters:   05/10/24 97.8 F (36.6 C) (Tympanic)   05/18/18 98.5 F (36.9 C) (Oral)   04/27/18 97.1 F (36.2 C) (Oral)     BP Readings from Last 3 Encounters:   05/15/24 (!) 149/92   05/10/24 113/74   05/18/18 96/57     Pulse Readings from Last 3 Encounters:   05/15/24 92   05/10/24 78   05/18/18 86       Body mass index is 32.91 kg/m.     General: Well developed and well nourished, no acute distress.  Eyes: Sclera anicteric, conjunctiva normal  HENT:  Nose and ears appear grossly normal, oropharynx  clear.  Chest/Lungs: No respiratory distress or abnormal audible sound.   Heart: Normal rate, regular rhythm.   Abdomen: Soft, non-distended with bowel sounds. No tenderness to palpation. No mass/organomegaly.  Musculoskeletal: No gross deformity, tenderness, or swelling.  Extremities:  No edema.  Skin: Warm and dry, no jaundice.  Neurologic: Alert and oriented.  No overt deficits.  Psychiatric: Cooperative, appropriate mood, normal affect.      LABS REVIEWED IN EPIC:  No results for input(s): WBC, HGB, HCT, PLT in the last 56199 hours.    No results for input(s): PT, INR in the last 56199 hours.    No results for input(s): NA, K, CL, CO2, BUN, CREAT, CA, ALB, PROT, BILITOTAL, ALKPHOS, ALT, AST, GLU, AMY, LIP, TSH, IRON, FERRITIN in the last 56199 hours.     No results found for: IRON, TIBC, FERRITIN    No results found for: VITD, 25HYDROXYDTO    No results found for: TSH, T4FREE      IMAGING/PROCEDURES REVIEWED IN EPIC:  No results found.   No results found for this or any previous visit.     No results found for this or any previous visit.             PCP None, MD, it was a pleasure to see Kimberly Chapman at [facility] and to participate in her care. Please do not hesitate to reach out to me if you have any clinical concerns to discuss.       Lonell VEAR Minks, PA        [1]   Past Medical History:  Diagnosis Date    Von Willebrand disease (CMS/HCC)    [2]   Past Surgical History:  Procedure Laterality Date    DILATION AND CURETTAGE OF UTERUS     [3]   Outpatient Medications Marked as Taking for the 05/15/24 encounter (Office Visit) with Bronte Kropf H, PA   Medication Sig Dispense Refill    lidocaine  (XYLOCAINE ) 5 % ointment Apply topically 2 (two) times daily 30 g 0    phenylephrine  (ANU-MED) 0.25 % suppository Place 1 suppository rectally 2 (two) times daily 12 suppository 0   [4]   Allergies  Allergen Reactions    Tramadol Itching   [5]    Family History  Problem Relation Name Age of Onset    No known problems Mother      No known problems Father      No known problems Sister      No known problems Brother      No known problems Maternal Aunt  No known problems Maternal Uncle      No known problems Paternal Aunt      No known problems Paternal Uncle      No known problems Maternal Grandmother      No known problems Maternal Grandfather      No known problems Paternal Grandmother      No known problems Paternal Grandfather      No known problems Other     [6]   Social History  Tobacco Use    Smoking status: Never    Smokeless tobacco: Never   Vaping Use    Vaping status: Never Used   Substance Use Topics    Alcohol use: Never    Drug use: Never

## 2024-05-15 NOTE — Patient Instructions (Signed)
 It was a pleasure taking care of you today!   Please call clinic or message me via MyChart with any questions or concerns.     Start Linzess  145 mcg once daily 30 minutes before food.   If you develop diarrhea/loose stools that last longer than 2 weeks, we will lower your dose to 72 mcg. If you are still having constipation, after two weeks we will increase your dose of Linzess  to 290 mcg You can message me in my chart to let me know.   Continue fiber daily.   Increase dietary soluble fiber intake. Drink plenty of water; at least 64 oz of water a day. Exercise.   Apply prescribed anal cream or suppositories twice a day for no more than 14 days in a row to avoid anal thinning.   Fiber supplementation with Metamucil or Benefiber: Start with 1 heaping teaspoon mixed with 8oz of liquid once daily.  You may feel a little bloated as your GI tract adjusts to the fiber. This should subside after about 2 weeks.  Drink at least 8 glasses of water daily (aim for 64 oz of water daily)  Refrain from straining or lingering (i.e. reading on toilet)  Regular physical exercise  Consider addition of bidet to your home toilet.  Avoid excessive wiping  Sitz baths with epsom salt as needed (2 to 3 times/day).

## 2024-05-21 ENCOUNTER — Telehealth (INDEPENDENT_AMBULATORY_CARE_PROVIDER_SITE_OTHER): Payer: Self-pay

## 2024-05-21 ENCOUNTER — Emergency Department
Admission: EM | Admit: 2024-05-21 | Discharge: 2024-05-21 | Disposition: A | Discharge status: Home / Self Care | Attending: Emergency Medicine | Admitting: Emergency Medicine

## 2024-05-21 DIAGNOSIS — L500 Allergic urticaria: Secondary | ICD-10-CM | POA: Insufficient documentation

## 2024-05-21 DIAGNOSIS — T7840XA Allergy, unspecified, initial encounter: Secondary | ICD-10-CM

## 2024-05-21 MED ORDER — DIPHENHYDRAMINE HCL 50 MG/ML IJ SOLN: 25.0000 mg | Freq: Once | INTRAMUSCULAR | Status: DC

## 2024-05-21 MED ORDER — FAMOTIDINE 20 MG PO TABS
20.0000 mg | ORAL_TABLET | Freq: Once | ORAL | Status: AC
  Administered 2024-05-21: 20 mg via ORAL
  Filled 2024-05-21: qty 1

## 2024-05-21 MED ORDER — ONDANSETRON 4 MG PO TBDP: 4.0000 mg | ORAL_TABLET | Freq: Four times a day (QID) | ORAL | 0 refills | Status: AC | PRN

## 2024-05-21 MED ORDER — PREDNISONE 20 MG PO TABS: 40.0000 mg | ORAL_TABLET | Freq: Every day | ORAL | 0 refills | Status: AC

## 2024-05-21 MED ORDER — DIPHENHYDRAMINE HCL 25 MG PO CAPS
25.0000 mg | ORAL_CAPSULE | Freq: Once | ORAL | Status: AC
  Administered 2024-05-21: 25 mg via ORAL
  Filled 2024-05-21: qty 1

## 2024-05-21 MED ORDER — ACETAMINOPHEN 500 MG PO TABS
1000.0000 mg | ORAL_TABLET | Freq: Once | ORAL | Status: AC
  Administered 2024-05-21: 1000 mg via ORAL
  Filled 2024-05-21: qty 2

## 2024-05-21 MED ORDER — PREDNISONE 20 MG PO TABS
60.0000 mg | ORAL_TABLET | Freq: Once | ORAL | Status: AC
  Administered 2024-05-21: 60 mg via ORAL
  Filled 2024-05-21: qty 3

## 2024-05-21 MED ORDER — ONDANSETRON 4 MG PO TBDP
4.0000 mg | ORAL_TABLET | Freq: Once | ORAL | Status: AC
  Administered 2024-05-21: 4 mg via ORAL
  Filled 2024-05-21: qty 1

## 2024-05-21 NOTE — ED Triage Notes (Addendum)
 Ambulatory c/o allergic reaction to medication started gradually since Saturday. Pt reports she is taking hydrocortisone  supp and rectal cream  first time for hemorrhoids since Friday. Pt reports facial swelling , hand swelling started today. Endorsing mild SOB. States she took Benadryl  this morning, no relief. Denies other possible causes for allergy. bilat hand swollen, lower lip swollen, endorsing hand tightness d/t swelling. Resp NAD. Axox4

## 2024-05-21 NOTE — ED Provider Notes (Signed)
 [facility]  Emergency Department Physician Note      Diagnosis/Disposition     ED Disposition:  Discharge    ED Diagnosis:  Allergic reaction, initial encounter    Discharge Medication List as of 05/21/2024  1:04 PM        START taking these medications    Details   ondansetron  (ZOFRAN -ODT) 4 MG disintegrating tablet Dissolve 1 tablet (4 mg) in the mouth every 6 (six) hours as needed for Nausea, Starting Tue 05/21/2024, E-Rx      predniSONE  (DELTASONE ) 20 MG tablet Take 2 tablets (40 mg) by mouth once daily for 5 days, Starting Tue 05/21/2024, Until Sun 05/26/2024, E-Rx               History of Present Illness      Nursing Triage Note:      Ambulatory c/o allergic reaction to medication started gradually since Saturday. Pt reports she is taking hydrocortisone  supp and rectal cream  first time for hemorrhoids since Friday. Pt reports facial swelling , hand swelling started today. Endorsing mild SOB. States she took Benadryl  this morning, no relief. Denies other possible causes for allergy. bilat hand swollen, lower lip swollen, endorsing hand tightness d/t swelling. Resp NAD. Axox4      Chief Complaint: Allergic Reaction    History of Present Illness  Kimberly Chapman is a 34 year old female who presents with urticaria.    Urticaria began on Friday involving her hands, neck, anterior chest, and face. She has no difficulty swallowing. Symptoms started after using a steroid suppository for hemorrhoids. She has tolerated prednisone  in the past but has not used this suppository steroid before.      Physical Exam     Pulse 90  BP 135/89  Resp 17  SpO2 99 %  Temp 99 F (37.2 C)     Physical Exam  GENERAL: Alert, cooperative, well developed, no acute distress.  HEENT: Normocephalic, normal oropharynx, moist mucous membranes.  NECK: No stridor.  CHEST: Clear to auscultation bilaterally, no wheezes, rhonchi, or crackles.  CARDIOVASCULAR: Normal heart rate and rhythm, S1 and S2 normal without  murmurs.  ABDOMEN: Soft, non-tender, non-distended, without organomegaly, normal bowel sounds.  EXTREMITIES: No cyanosis or edema.  NEUROLOGICAL: Cranial nerves grossly intact, moves all extremities without gross motor or sensory deficit.  SKIN: Urticaria on hands, forearms, anterior neck, and anterior chest.      Medical Decision Making        Initial Differential Diagnosis:  Anaphylaxis, allergic reaction         Medical Decision Making  Patient presenting with diffuse allergic reaction and urticaria.  Patient was actually given prednisone  with no additional reaction in the emergency department.  Patient was given Zofran  as well as Pepcid .  Patient observed for 2 hours no stridor no shortness of breath.  Patient hemodynamically stable discharged home.  Patient given prescription for prednisone  and Pepcid  patient instructed to continue taking Benadryl  follow-up for primary care physician    Risk  OTC drugs.  Prescription drug management.              Interpretations                          Procedures      Procedures      Supplemental Encounter Data   Medical History[7]  Past Surgical History[8]  Social History[9]  Family History[10]  Allergies[11]    Medications Administered:  Medications  famotidine  (PEPCID ) tablet 20 mg (20 mg Oral Given 05/21/24 0945)   diphenhydrAMINE  (BENADRYL ) capsule 25 mg (25 mg Oral Given 05/21/24 1104)   predniSONE  (DELTASONE ) tablet 60 mg (60 mg Oral Given 05/21/24 1104)   acetaminophen  (TYLENOL ) tablet 1,000 mg (1,000 mg Oral Given 05/21/24 1239)   ondansetron  (ZOFRAN -ODT) disintegrating tablet 4 mg (4 mg Oral Given 05/21/24 1239)     Laboratory and Imaging Studies:     No orders to display       Attestations   I am the primary attending physician of record for this patient.          [7]   Past Medical History:  Diagnosis Date    Von Willebrand disease (CMS/HCC)    [8]   Past Surgical History:  Procedure Laterality Date    DILATION AND CURETTAGE OF UTERUS     [9]   Social  History  Tobacco Use    Smoking status: Never    Smokeless tobacco: Never   Vaping Use    Vaping status: Never Used   Substance Use Topics    Alcohol use: Never    Drug use: Never   [10]   Family History  Problem Relation Name Age of Onset    No known problems Mother      No known problems Father      No known problems Sister      No known problems Brother      No known problems Maternal Aunt      No known problems Maternal Uncle      No known problems Paternal Aunt      No known problems Paternal Uncle      No known problems Maternal Grandmother      No known problems Maternal Grandfather      No known problems Paternal Grandmother      No known problems Paternal Grandfather      No known problems Other     [11]   Allergies  Allergen Reactions    Hydrocortisone  Facial Swelling     Bilat hands swelling with lip swelling    Tramadol Itching        Fabian Frederic BIRCH, MD  05/23/24 1515

## 2024-05-21 NOTE — Telephone Encounter (Signed)
 This nurse received a page from front desk stating patient was on the line with the call center. Call was transferred.     Patient reports: Swelling in face, eyes, lips and legs, hives, itching, states she can't feel her hands, her ears are hot, and itching has started to go to her abdomen and back.  Patient complaining of SOB and states she is currently driving.    This nurse advised that if she is experiencing SOB she needs to stop driving and call for an ambulance. Patient stated she was close to ED. I advised that she go to nearest Urgent Care or ED to be evaluated.   Patient said she was on her way. Patient was not gasping on the phone.     Patient states the only change has been the Anusol  suppositories she started over the weekend.     Pt notified about ED precautions related to patient. Pt verbalized acknowledgement and confirmed understanding. Pt denies any further questions or concerns at this time.

## 2024-05-21 NOTE — Discharge Instructions (Signed)
 Discontinue suppository.  Continue Benadryl  with prednisone
